# Patient Record
Sex: Female | Born: 1935 | Race: White | Hispanic: No | Marital: Married | Smoking: Former smoker
Health system: Southern US, Community
[De-identification: ages and names within clinical notes are randomized; demographics above are authoritative.]

## PROBLEM LIST (undated history)

## (undated) DIAGNOSIS — I214 Non-ST elevation (NSTEMI) myocardial infarction: Secondary | ICD-10-CM

## (undated) DIAGNOSIS — I10 Essential (primary) hypertension: Secondary | ICD-10-CM

## (undated) HISTORY — PX: ABDOMINAL HYSTERECTOMY: SHX81

## (undated) HISTORY — DX: Non-ST elevation (NSTEMI) myocardial infarction: I21.4

---

## 2014-06-07 DIAGNOSIS — L820 Inflamed seborrheic keratosis: Secondary | ICD-10-CM | POA: Diagnosis not present

## 2014-06-07 DIAGNOSIS — L303 Infective dermatitis: Secondary | ICD-10-CM | POA: Diagnosis not present

## 2014-06-07 DIAGNOSIS — D225 Melanocytic nevi of trunk: Secondary | ICD-10-CM | POA: Diagnosis not present

## 2014-06-07 DIAGNOSIS — C8200 Follicular lymphoma grade I, unspecified site: Secondary | ICD-10-CM | POA: Diagnosis not present

## 2014-06-07 DIAGNOSIS — D485 Neoplasm of uncertain behavior of skin: Secondary | ICD-10-CM | POA: Diagnosis not present

## 2014-09-12 DIAGNOSIS — R05 Cough: Secondary | ICD-10-CM | POA: Diagnosis not present

## 2014-09-12 DIAGNOSIS — J309 Allergic rhinitis, unspecified: Secondary | ICD-10-CM | POA: Diagnosis not present

## 2014-11-22 DIAGNOSIS — J019 Acute sinusitis, unspecified: Secondary | ICD-10-CM | POA: Diagnosis not present

## 2015-03-18 DIAGNOSIS — H9201 Otalgia, right ear: Secondary | ICD-10-CM | POA: Diagnosis not present

## 2015-03-18 DIAGNOSIS — K047 Periapical abscess without sinus: Secondary | ICD-10-CM | POA: Diagnosis not present

## 2015-10-17 DIAGNOSIS — M7130 Other bursal cyst, unspecified site: Secondary | ICD-10-CM | POA: Diagnosis not present

## 2015-10-17 DIAGNOSIS — Z1283 Encounter for screening for malignant neoplasm of skin: Secondary | ICD-10-CM | POA: Diagnosis not present

## 2015-10-17 DIAGNOSIS — L570 Actinic keratosis: Secondary | ICD-10-CM | POA: Diagnosis not present

## 2015-10-17 DIAGNOSIS — X32XXXD Exposure to sunlight, subsequent encounter: Secondary | ICD-10-CM | POA: Diagnosis not present

## 2015-10-17 DIAGNOSIS — L820 Inflamed seborrheic keratosis: Secondary | ICD-10-CM | POA: Diagnosis not present

## 2015-11-04 DIAGNOSIS — R05 Cough: Secondary | ICD-10-CM | POA: Diagnosis not present

## 2015-11-12 DIAGNOSIS — M542 Cervicalgia: Secondary | ICD-10-CM | POA: Diagnosis not present

## 2015-11-18 DIAGNOSIS — N10 Acute tubulo-interstitial nephritis: Secondary | ICD-10-CM | POA: Diagnosis not present

## 2015-11-18 DIAGNOSIS — R509 Fever, unspecified: Secondary | ICD-10-CM | POA: Diagnosis not present

## 2015-11-18 DIAGNOSIS — M542 Cervicalgia: Secondary | ICD-10-CM | POA: Diagnosis not present

## 2015-11-19 DIAGNOSIS — S134XXA Sprain of ligaments of cervical spine, initial encounter: Secondary | ICD-10-CM | POA: Diagnosis not present

## 2015-11-19 DIAGNOSIS — S233XXA Sprain of ligaments of thoracic spine, initial encounter: Secondary | ICD-10-CM | POA: Diagnosis not present

## 2015-11-19 DIAGNOSIS — M47812 Spondylosis without myelopathy or radiculopathy, cervical region: Secondary | ICD-10-CM | POA: Diagnosis not present

## 2015-11-19 DIAGNOSIS — M9901 Segmental and somatic dysfunction of cervical region: Secondary | ICD-10-CM | POA: Diagnosis not present

## 2015-11-20 DIAGNOSIS — M47812 Spondylosis without myelopathy or radiculopathy, cervical region: Secondary | ICD-10-CM | POA: Diagnosis not present

## 2015-11-20 DIAGNOSIS — M9901 Segmental and somatic dysfunction of cervical region: Secondary | ICD-10-CM | POA: Diagnosis not present

## 2015-11-20 DIAGNOSIS — S233XXA Sprain of ligaments of thoracic spine, initial encounter: Secondary | ICD-10-CM | POA: Diagnosis not present

## 2015-11-20 DIAGNOSIS — S134XXA Sprain of ligaments of cervical spine, initial encounter: Secondary | ICD-10-CM | POA: Diagnosis not present

## 2015-11-21 DIAGNOSIS — M47812 Spondylosis without myelopathy or radiculopathy, cervical region: Secondary | ICD-10-CM | POA: Diagnosis not present

## 2015-11-21 DIAGNOSIS — S233XXA Sprain of ligaments of thoracic spine, initial encounter: Secondary | ICD-10-CM | POA: Diagnosis not present

## 2015-11-21 DIAGNOSIS — S134XXA Sprain of ligaments of cervical spine, initial encounter: Secondary | ICD-10-CM | POA: Diagnosis not present

## 2015-11-21 DIAGNOSIS — M9901 Segmental and somatic dysfunction of cervical region: Secondary | ICD-10-CM | POA: Diagnosis not present

## 2015-11-25 DIAGNOSIS — M47812 Spondylosis without myelopathy or radiculopathy, cervical region: Secondary | ICD-10-CM | POA: Diagnosis not present

## 2015-11-25 DIAGNOSIS — M9901 Segmental and somatic dysfunction of cervical region: Secondary | ICD-10-CM | POA: Diagnosis not present

## 2015-11-25 DIAGNOSIS — S233XXA Sprain of ligaments of thoracic spine, initial encounter: Secondary | ICD-10-CM | POA: Diagnosis not present

## 2015-11-25 DIAGNOSIS — S134XXA Sprain of ligaments of cervical spine, initial encounter: Secondary | ICD-10-CM | POA: Diagnosis not present

## 2015-11-29 DIAGNOSIS — M9901 Segmental and somatic dysfunction of cervical region: Secondary | ICD-10-CM | POA: Diagnosis not present

## 2015-11-29 DIAGNOSIS — S233XXA Sprain of ligaments of thoracic spine, initial encounter: Secondary | ICD-10-CM | POA: Diagnosis not present

## 2015-11-29 DIAGNOSIS — M47812 Spondylosis without myelopathy or radiculopathy, cervical region: Secondary | ICD-10-CM | POA: Diagnosis not present

## 2015-11-29 DIAGNOSIS — S134XXA Sprain of ligaments of cervical spine, initial encounter: Secondary | ICD-10-CM | POA: Diagnosis not present

## 2015-12-02 DIAGNOSIS — S233XXA Sprain of ligaments of thoracic spine, initial encounter: Secondary | ICD-10-CM | POA: Diagnosis not present

## 2015-12-02 DIAGNOSIS — M47812 Spondylosis without myelopathy or radiculopathy, cervical region: Secondary | ICD-10-CM | POA: Diagnosis not present

## 2015-12-02 DIAGNOSIS — M9901 Segmental and somatic dysfunction of cervical region: Secondary | ICD-10-CM | POA: Diagnosis not present

## 2015-12-02 DIAGNOSIS — S134XXA Sprain of ligaments of cervical spine, initial encounter: Secondary | ICD-10-CM | POA: Diagnosis not present

## 2015-12-04 DIAGNOSIS — S134XXA Sprain of ligaments of cervical spine, initial encounter: Secondary | ICD-10-CM | POA: Diagnosis not present

## 2015-12-04 DIAGNOSIS — M9901 Segmental and somatic dysfunction of cervical region: Secondary | ICD-10-CM | POA: Diagnosis not present

## 2015-12-04 DIAGNOSIS — M47812 Spondylosis without myelopathy or radiculopathy, cervical region: Secondary | ICD-10-CM | POA: Diagnosis not present

## 2015-12-04 DIAGNOSIS — S233XXA Sprain of ligaments of thoracic spine, initial encounter: Secondary | ICD-10-CM | POA: Diagnosis not present

## 2015-12-09 DIAGNOSIS — S134XXA Sprain of ligaments of cervical spine, initial encounter: Secondary | ICD-10-CM | POA: Diagnosis not present

## 2015-12-09 DIAGNOSIS — M9901 Segmental and somatic dysfunction of cervical region: Secondary | ICD-10-CM | POA: Diagnosis not present

## 2015-12-09 DIAGNOSIS — M47812 Spondylosis without myelopathy or radiculopathy, cervical region: Secondary | ICD-10-CM | POA: Diagnosis not present

## 2015-12-09 DIAGNOSIS — S233XXA Sprain of ligaments of thoracic spine, initial encounter: Secondary | ICD-10-CM | POA: Diagnosis not present

## 2015-12-16 DIAGNOSIS — S233XXA Sprain of ligaments of thoracic spine, initial encounter: Secondary | ICD-10-CM | POA: Diagnosis not present

## 2015-12-16 DIAGNOSIS — M9901 Segmental and somatic dysfunction of cervical region: Secondary | ICD-10-CM | POA: Diagnosis not present

## 2015-12-16 DIAGNOSIS — S134XXA Sprain of ligaments of cervical spine, initial encounter: Secondary | ICD-10-CM | POA: Diagnosis not present

## 2015-12-16 DIAGNOSIS — M47812 Spondylosis without myelopathy or radiculopathy, cervical region: Secondary | ICD-10-CM | POA: Diagnosis not present

## 2016-02-13 DIAGNOSIS — L218 Other seborrheic dermatitis: Secondary | ICD-10-CM | POA: Diagnosis not present

## 2016-02-13 DIAGNOSIS — L820 Inflamed seborrheic keratosis: Secondary | ICD-10-CM | POA: Diagnosis not present

## 2016-03-09 DIAGNOSIS — Z23 Encounter for immunization: Secondary | ICD-10-CM | POA: Diagnosis not present

## 2016-04-15 DIAGNOSIS — Z23 Encounter for immunization: Secondary | ICD-10-CM | POA: Diagnosis not present

## 2016-04-21 DIAGNOSIS — K921 Melena: Secondary | ICD-10-CM | POA: Diagnosis not present

## 2016-04-23 DIAGNOSIS — L570 Actinic keratosis: Secondary | ICD-10-CM | POA: Diagnosis not present

## 2016-04-23 DIAGNOSIS — X32XXXD Exposure to sunlight, subsequent encounter: Secondary | ICD-10-CM | POA: Diagnosis not present

## 2016-04-23 DIAGNOSIS — L820 Inflamed seborrheic keratosis: Secondary | ICD-10-CM | POA: Diagnosis not present

## 2016-04-28 ENCOUNTER — Inpatient Hospital Stay (HOSPITAL_COMMUNITY)
Admission: EM | Admit: 2016-04-28 | Discharge: 2016-04-30 | DRG: 247 | Disposition: A | Discharge status: Home / Self Care | Payer: Medicare Other | Attending: Cardiovascular Disease | Admitting: Cardiovascular Disease

## 2016-04-28 ENCOUNTER — Encounter (INDEPENDENT_AMBULATORY_CARE_PROVIDER_SITE_OTHER): Payer: Self-pay | Admitting: Internal Medicine

## 2016-04-28 ENCOUNTER — Encounter (HOSPITAL_COMMUNITY): Payer: Self-pay | Admitting: *Deleted

## 2016-04-28 ENCOUNTER — Encounter (INDEPENDENT_AMBULATORY_CARE_PROVIDER_SITE_OTHER): Payer: Self-pay

## 2016-04-28 ENCOUNTER — Emergency Department (HOSPITAL_COMMUNITY): Payer: Medicare Other

## 2016-04-28 DIAGNOSIS — I4891 Unspecified atrial fibrillation: Secondary | ICD-10-CM | POA: Diagnosis not present

## 2016-04-28 DIAGNOSIS — R079 Chest pain, unspecified: Secondary | ICD-10-CM | POA: Diagnosis not present

## 2016-04-28 DIAGNOSIS — I495 Sick sinus syndrome: Secondary | ICD-10-CM | POA: Diagnosis not present

## 2016-04-28 DIAGNOSIS — I214 Non-ST elevation (NSTEMI) myocardial infarction: Secondary | ICD-10-CM | POA: Diagnosis present

## 2016-04-28 DIAGNOSIS — I498 Other specified cardiac arrhythmias: Secondary | ICD-10-CM

## 2016-04-28 DIAGNOSIS — Z79899 Other long term (current) drug therapy: Secondary | ICD-10-CM

## 2016-04-28 DIAGNOSIS — I10 Essential (primary) hypertension: Secondary | ICD-10-CM

## 2016-04-28 DIAGNOSIS — I441 Atrioventricular block, second degree: Secondary | ICD-10-CM

## 2016-04-28 DIAGNOSIS — Z87891 Personal history of nicotine dependence: Secondary | ICD-10-CM | POA: Diagnosis not present

## 2016-04-28 DIAGNOSIS — E876 Hypokalemia: Secondary | ICD-10-CM | POA: Diagnosis present

## 2016-04-28 DIAGNOSIS — Z8249 Family history of ischemic heart disease and other diseases of the circulatory system: Secondary | ICD-10-CM

## 2016-04-28 DIAGNOSIS — Z955 Presence of coronary angioplasty implant and graft: Secondary | ICD-10-CM

## 2016-04-28 DIAGNOSIS — R7989 Other specified abnormal findings of blood chemistry: Secondary | ICD-10-CM

## 2016-04-28 DIAGNOSIS — R69 Illness, unspecified: Secondary | ICD-10-CM | POA: Diagnosis not present

## 2016-04-28 DIAGNOSIS — I2510 Atherosclerotic heart disease of native coronary artery without angina pectoris: Secondary | ICD-10-CM | POA: Diagnosis present

## 2016-04-28 DIAGNOSIS — I509 Heart failure, unspecified: Secondary | ICD-10-CM | POA: Diagnosis not present

## 2016-04-28 DIAGNOSIS — R0602 Shortness of breath: Secondary | ICD-10-CM | POA: Diagnosis not present

## 2016-04-28 DIAGNOSIS — R778 Other specified abnormalities of plasma proteins: Secondary | ICD-10-CM

## 2016-04-28 DIAGNOSIS — R531 Weakness: Secondary | ICD-10-CM | POA: Diagnosis not present

## 2016-04-28 DIAGNOSIS — Z6824 Body mass index (BMI) 24.0-24.9, adult: Secondary | ICD-10-CM | POA: Diagnosis not present

## 2016-04-28 DIAGNOSIS — I491 Atrial premature depolarization: Secondary | ICD-10-CM | POA: Diagnosis not present

## 2016-04-28 DIAGNOSIS — D649 Anemia, unspecified: Secondary | ICD-10-CM | POA: Diagnosis not present

## 2016-04-28 HISTORY — DX: Essential (primary) hypertension: I10

## 2016-04-28 LAB — COMPREHENSIVE METABOLIC PANEL
ALT: 24 U/L (ref 14–54)
ANION GAP: 12 (ref 5–15)
AST: 37 U/L (ref 15–41)
Albumin: 4.4 g/dL (ref 3.5–5.0)
Alkaline Phosphatase: 82 U/L (ref 38–126)
BUN: 21 mg/dL — ABNORMAL HIGH (ref 6–20)
CHLORIDE: 100 mmol/L — AB (ref 101–111)
CO2: 22 mmol/L (ref 22–32)
CREATININE: 0.98 mg/dL (ref 0.44–1.00)
Calcium: 9.6 mg/dL (ref 8.9–10.3)
GFR calc non Af Amer: 54 mL/min — ABNORMAL LOW (ref >60)
Glucose, Bld: 113 mg/dL — ABNORMAL HIGH (ref 65–99)
POTASSIUM: 2.7 mmol/L — AB (ref 3.5–5.1)
SODIUM: 134 mmol/L — AB (ref 135–145)
Total Bilirubin: 0.6 mg/dL (ref 0.3–1.2)
Total Protein: 7.3 g/dL (ref 6.5–8.1)

## 2016-04-28 LAB — CBC WITH DIFFERENTIAL/PLATELET
Basophils Absolute: 0 10*3/uL (ref 0.0–0.1)
Basophils Relative: 0 %
EOS ABS: 0 10*3/uL (ref 0.0–0.7)
EOS PCT: 0 %
HCT: 33.4 % — ABNORMAL LOW (ref 36.0–46.0)
Hemoglobin: 10.7 g/dL — ABNORMAL LOW (ref 12.0–15.0)
LYMPHS ABS: 2 10*3/uL (ref 0.7–4.0)
Lymphocytes Relative: 17 %
MCH: 25.5 pg — AB (ref 26.0–34.0)
MCHC: 32 g/dL (ref 30.0–36.0)
MCV: 79.7 fL (ref 78.0–100.0)
MONO ABS: 1.3 10*3/uL — AB (ref 0.1–1.0)
Monocytes Relative: 10 %
Neutro Abs: 8.6 10*3/uL — ABNORMAL HIGH (ref 1.7–7.7)
Neutrophils Relative %: 73 %
PLATELETS: 366 10*3/uL (ref 150–400)
RBC: 4.19 MIL/uL (ref 3.87–5.11)
RDW: 14.5 % (ref 11.5–15.5)
WBC: 12 10*3/uL — AB (ref 4.0–10.5)

## 2016-04-28 LAB — TROPONIN I
TROPONIN I: 1.31 ng/mL — AB (ref <0.03)
TROPONIN I: 1.14 ng/mL — AB (ref <0.03)

## 2016-04-28 LAB — MAGNESIUM: MAGNESIUM: 1.5 mg/dL — AB (ref 1.7–2.4)

## 2016-04-28 MED ORDER — PANTOPRAZOLE SODIUM 40 MG PO TBEC
40.0000 mg | DELAYED_RELEASE_TABLET | Freq: Every day | ORAL | Status: DC
  Administered 2016-04-29 – 2016-04-30 (×2): 40 mg via ORAL
  Filled 2016-04-28 (×2): qty 1

## 2016-04-28 MED ORDER — HEPARIN BOLUS VIA INFUSION
3500.0000 [IU] | Freq: Once | INTRAVENOUS | Status: AC
  Administered 2016-04-28: 3500 [IU] via INTRAVENOUS
  Filled 2016-04-28: qty 3500

## 2016-04-28 MED ORDER — SODIUM CHLORIDE 0.9 % IV SOLN
30.0000 meq | Freq: Once | INTRAVENOUS | Status: AC
  Administered 2016-04-28: 30 meq via INTRAVENOUS
  Filled 2016-04-28: qty 15

## 2016-04-28 MED ORDER — POTASSIUM CHLORIDE CRYS ER 20 MEQ PO TBCR
40.0000 meq | EXTENDED_RELEASE_TABLET | Freq: Two times a day (BID) | ORAL | Status: DC
  Administered 2016-04-28 – 2016-04-30 (×5): 40 meq via ORAL
  Filled 2016-04-28 (×5): qty 2

## 2016-04-28 MED ORDER — ASPIRIN EC 81 MG PO TBEC
81.0000 mg | DELAYED_RELEASE_TABLET | Freq: Every day | ORAL | Status: DC
  Administered 2016-04-29 – 2016-04-30 (×2): 81 mg via ORAL
  Filled 2016-04-28 (×2): qty 1

## 2016-04-28 MED ORDER — POTASSIUM CHLORIDE 10 MEQ/100ML IV SOLN
10.0000 meq | INTRAVENOUS | Status: DC
  Administered 2016-04-28: 10 meq via INTRAVENOUS
  Filled 2016-04-28: qty 100

## 2016-04-28 MED ORDER — ASPIRIN 81 MG PO CHEW
324.0000 mg | CHEWABLE_TABLET | Freq: Once | ORAL | Status: AC
  Administered 2016-04-28: 324 mg via ORAL
  Filled 2016-04-28: qty 4

## 2016-04-28 MED ORDER — MAGNESIUM SULFATE 2 GM/50ML IV SOLN
2.0000 g | Freq: Once | INTRAVENOUS | Status: AC
  Administered 2016-04-28: 2 g via INTRAVENOUS
  Filled 2016-04-28: qty 50

## 2016-04-28 MED ORDER — NITROGLYCERIN 0.4 MG SL SUBL: 0.4000 mg | SUBLINGUAL_TABLET | SUBLINGUAL | Status: DC | PRN

## 2016-04-28 MED ORDER — ATORVASTATIN CALCIUM 40 MG PO TABS
40.0000 mg | ORAL_TABLET | Freq: Every day | ORAL | Status: DC
  Administered 2016-04-28 – 2016-04-29 (×2): 40 mg via ORAL
  Filled 2016-04-28 (×2): qty 1

## 2016-04-28 MED ORDER — ACETAMINOPHEN 325 MG PO TABS: 650.0000 mg | ORAL_TABLET | ORAL | Status: DC | PRN

## 2016-04-28 MED ORDER — ONDANSETRON HCL 4 MG/2ML IJ SOLN
4.0000 mg | Freq: Four times a day (QID) | INTRAMUSCULAR | Status: DC | PRN
  Administered 2016-04-29: 20:00:00 4 mg via INTRAVENOUS
  Filled 2016-04-28: qty 2

## 2016-04-28 MED ORDER — HEPARIN (PORCINE) IN NACL 100-0.45 UNIT/ML-% IJ SOLN
800.0000 [IU]/h | INTRAMUSCULAR | Status: DC
  Administered 2016-04-28: 700 [IU]/h via INTRAVENOUS
  Filled 2016-04-28: qty 250

## 2016-04-28 NOTE — ED Notes (Signed)
CRITICAL VALUE ALERT  Critical value received:troponin  Date of notification: 04/28/2016  Time of notification:1544  Critical value read back yes  Nurse who received alert: Beyonce Sawatzky   MD notified (1st page):  1544  Time of first page: 1544  MD notified (2nd page):  Time of second page:  Responding MD: issacs  Time MD responded:1544

## 2016-04-28 NOTE — H&P (Addendum)
CARDIOLOGY CONSULT NOTE  Patient ID: Adriana Moyer MRN: [id number] DOB/AGE: 01/22/1936 80 y.o.  Admit date: 04/28/2016 Primary Physician: Jeri Modena Referring Physician:   Reason for Admission: elevated troponin  HPI: 80 yr old woman with a PMH significant for hypertension presented to her PCP with complaints of fatigue. She was lightheaded and dizzy with shortness of breath. Denies chest pain. This past Sunday, she briefly lost consciousness while speaking with her husband, possibly no more than 15 seconds. Denies antecedent chest pain or palpitations. She then felt markedly fatigued and thought she had the flu. She had a little diarrhea as well. Denies dysuria and fevers.  Last night, she did complain of dull retrosternal chest pressure lasting hours. She was then sent from  PCP to [facility], and subsequently to [facility] after troponin found to be elevated to 1.31. Also hypokalemic, 2.7, and hypomagnesemic, 1.5.  Initial ECG shows ectopic atrial rhythm followed by a pause followed by junctional beats, with other ECG's showing the same.    No Known Allergies  Current Facility-Administered Medications  Medication Dose Route Frequency Provider Last Rate Last Dose  . potassium chloride 30 mEq in sodium chloride 0.9 % 265 mL (KCL MULTIRUN) IVPB  30 mEq Intravenous Once Dorie Rank, MD      . potassium chloride SA (K-DUR,KLOR-CON) CR tablet 40 mEq  40 mEq Oral BID Duffy Bruce, MD   40 mEq at 04/28/16 1615   Current Outpatient Prescriptions  Medication Sig Dispense Refill  . clotrimazole (LOTRIMIN) 1 % cream Apply 1 application topically daily.    . hydrochlorothiazide (HYDRODIURIL) 25 MG tablet Take 25 mg by mouth daily.    Marland Kitchen omeprazole (PRILOSEC) 20 MG capsule Take 20 mg by mouth daily.      Past Medical History:  Diagnosis Date  . Hypertension     Past Surgical History:  Procedure Laterality Date  . ABDOMINAL HYSTERECTOMY      Social  History   Social History  . Marital status: Married    Spouse name: N/A  . Number of children: N/A  . Years of education: N/A   Occupational History  . Not on file.   Social History Main Topics  . Smoking status: Former Research scientist (life sciences)  . Smokeless tobacco: Never Used  . Alcohol use No  . Drug use: No  . Sexual activity: Not on file   Other Topics Concern  . Not on file   Social History Narrative  . No narrative on file     No family history of premature CAD in 1st degree relatives.  Prior to Admission medications   Medication Sig Start Date End Date Taking? Authorizing Provider  clotrimazole (LOTRIMIN) 1 % cream Apply 1 application topically daily.   Yes Historical Provider, MD  hydrochlorothiazide (HYDRODIURIL) 25 MG tablet Take 25 mg by mouth daily.   Yes Historical Provider, MD  omeprazole (PRILOSEC) 20 MG capsule Take 20 mg by mouth daily.   Yes Historical Provider, MD     Review of systems complete and found to be negative unless listed above in HPI     Physical exam Blood pressure 155/77, pulse 72, temperature 98.3 F (36.8 C), temperature source Oral, resp. rate 13, height 5\' 2"  (1.575 m), weight 132 lb (59.9 kg), SpO2 99 %. General: NAD Neck: No JVD, no thyromegaly or thyroid nodule.  Lungs: Clear to auscultation bilaterally with normal respiratory effort. CV: Nondisplaced PMI. Regular rate and irregular rhythm, normal S1/S2, no  S3/S4, no murmur.  No peripheral edema.  No carotid bruit.  Normal pedal pulses.  Abdomen: Soft, nontender, no hepatosplenomegaly, no distention.  Skin: Intact without lesions or rashes.  Neurologic: Alert and oriented x 3.  Psych: Normal affect. Extremities: No clubbing or cyanosis.  HEENT: Normal.   ECG: Most recent ECG reviewed.  Labs:   Lab Results  Component Value Date   WBC 12.0 (H) 04/28/2016   HGB 10.7 (L) 04/28/2016   HCT 33.4 (L) 04/28/2016   MCV 79.7 04/28/2016   PLT 366 04/28/2016    Recent Labs Lab  04/28/16 1410  NA 134*  K 2.7*  CL 100*  CO2 22  BUN 21*  CREATININE 0.98  CALCIUM 9.6  PROT 7.3  BILITOT 0.6  ALKPHOS 82  ALT 24  AST 37  GLUCOSE 113*   Lab Results  Component Value Date   TROPONINI 1.31 (HH) 04/28/2016   No results found for: CHOL No results found for: HDL No results found for: LDLCALC No results found for: TRIG No results found for: CHOLHDL No results found for: LDLDIRECT       Studies: Dg Chest 2 View  Result Date: 04/28/2016 CLINICAL DATA:  Onset of shortness of breath, body aches, and chills last night. History of hypertension, former smoker. EXAM: CHEST  2 VIEW COMPARISON:  None in PACs FINDINGS: The lungs are adequately inflated. There is no focal infiltrate. There is prominence of the costochondral calcification in the lower ribs bilaterally. There is no pleural effusion or pneumothorax. The heart and pulmonary vascularity are normal. There is calcification within the wall of the tortuous thoracic aorta. The mediastinum is normal in width. The thoracic vertebral bodies are preserved in height. There is multilevel degenerative disc space narrowing. There is moderate to severe degenerative change of both shoulders. IMPRESSION: There is no evidence of pneumonia nor other acute cardiopulmonary abnormality. Thoracic aortic atherosclerosis. Electronically Signed   By: David  Martinique M.D.   On: 04/28/2016 15:09    ASSESSMENT AND PLAN:  1. Arrhythmia: ECG's described above. Will replete potassium and reevaluate rhythm.  2. NSTEMI: Initial troponin 1.31. Will cycle 2 more sets. If they continue to rise, will plan for coronary angiography on 12/6. Obtain echocardiogram tomorrow. Given arrhythmia with junctional beats, will hold off on beta blockers for now until rhythm stabilizes after K repletement. Will start Lipitor 40 mg.  3. HTN: Controlled. Will monitor.  4. Hypokalemia: Possibly related to diarrhea. Being repleted.    Signed: Kate Sable,  M.D., F.A.C.C.  04/28/2016, 7:47 PM

## 2016-04-28 NOTE — Progress Notes (Signed)
CRITICAL VALUE ALERT  Critical value received:  Troponin 1.14 Date of notification:  04/28/2016  Time of notification:  2300  Critical value read back: YES  Nurse who received alert:  Dorise Bullion  MD notified (1st page):  Ellyn Hack  Time of first page:  2302  MD notified (2nd page):  Time of second page:  Responding MD:  Ellyn Hack   Time MD responded:  2305  No changes at this time

## 2016-04-28 NOTE — ED Notes (Signed)
Attempted report x1. 

## 2016-04-28 NOTE — ED Notes (Signed)
[place] sandwich given to pt per VO Dr. Tomi Bamberger

## 2016-04-28 NOTE — Progress Notes (Signed)
ANTICOAGULATION CONSULT NOTE - Initial Consult  Pharmacy Consult for heparin Indication: chest pain/ACS  No Known Allergies  Patient Measurements: Height: 5\' 2"  (157.5 cm) Weight: 132 lb (59.9 kg) IBW/kg (Calculated) : 50.1 Heparin Dosing Weight: 60 kg  Vital Signs: Temp: 98.3 F (36.8 C) (12/05 1803) Temp Source: Oral (12/05 1803) BP: 129/51 (12/05 1957) Pulse Rate: 64 (12/05 1957)  Labs:  Recent Labs  04/28/16 1410  HGB 10.7*  HCT 33.4*  PLT 366  CREATININE 0.98  TROPONINI 1.31*    Estimated Creatinine Clearance: 37.4 mL/min (by C-G formula based on SCr of 0.98 mg/dL).  Assessment: 80 yo f presenting with elevated troponin   PMH: HTN  Anticoag: none pta - iv hep for r/o ACS  Nephro: SCr 0.98  Heme/Onc: H&H 10.7/33.4, Plt 366  Goal of Therapy:  Heparin level 0.3-0.7 units/ml Monitor platelets by anticoagulation protocol: Yes   Plan:  Heparin bolus 3500 units/hr Initial drip at 700 units/hr Initial HL 0500  Daily HL, CBC F/U Cards plans  Levester Fresh, PharmD, BCPS, BCCCP Clinical Pharmacist Clinical phone for 04/28/2016 [phone] 04/28/2016 8:36 PM

## 2016-04-28 NOTE — ED Notes (Signed)
Paged cardiology to [facility] @[phone] 

## 2016-04-28 NOTE — ED Triage Notes (Signed)
Pt reports weakness and syncopal episode since Sunday. Pt had rectal bleeding that stopped last week and was ongoing for 3 weeks. Pt went to see PCP today and was told she had new onset A. Fib and was anemic. Denies SOB, chest pain. Pt reports headache and sore throat also.

## 2016-04-28 NOTE — ED Notes (Signed)
Gave pt water per dr.

## 2016-04-28 NOTE — ED Notes (Signed)
Per EMS, Pt transferred from [facility]. Pt denies chest pain and SHOB. Pt not in any acute distress.

## 2016-04-28 NOTE — ED Provider Notes (Signed)
[place] DEPT Provider Note   CSN: [id number] Arrival date & time: 04/28/16  1336     History   Chief Complaint Chief Complaint  Patient presents with  . Abnormal ECG    HPI Adriana Moyer is a 80 y.o. female.  HPI 80 year old female with past medical history of hypertension who presents from urgent care for evaluation of possible atrial fibrillation. Patient states that over the last several days, she has felt generally fatigued. She was worried that she had the flu and she felt lightheaded, dizzy with ambulation, as well as more short of breath and usual. On Sunday, she also was reportedly speaking to her husband while standing when she abruptly lost consciousness. She had no prodrome. She immediately regained consciousness and felt generally tired throughout the rest of the day. Last night, she did have some dull, aching, substernal chest pressure that resolved over several hours. She strongly denies any current chest pain. She went to urgent care today for evaluation, at which time EKG showed reported atrial fibrillation. She was subsequent sent here for evaluation. No history of cardiac arrhythmia or coronary history. She does not have a cardiologist.  Past Medical History:  Diagnosis Date  . Hypertension     There are no active problems to display for this patient.   Past Surgical History:  Procedure Laterality Date  . ABDOMINAL HYSTERECTOMY      OB History    No data available       Home Medications    Prior to Admission medications   Medication Sig Start Date End Date Taking? Authorizing Provider  clotrimazole (LOTRIMIN) 1 % cream Apply 1 application topically daily.   Yes Historical Provider, MD  hydrochlorothiazide (HYDRODIURIL) 25 MG tablet Take 25 mg by mouth daily.   Yes Historical Provider, MD  omeprazole (PRILOSEC) 20 MG capsule Take 20 mg by mouth daily.   Yes Historical Provider, MD    Family History No family history on file.  Social  History Social History  Substance Use Topics  . Smoking status: Former Research scientist (life sciences)  . Smokeless tobacco: Never Used  . Alcohol use No     Allergies   Patient has no known allergies.   Review of Systems Review of Systems  Constitutional: Positive for fatigue. Negative for chills and fever.  HENT: Negative for congestion, rhinorrhea and sore throat.   Eyes: Negative for visual disturbance.  Respiratory: Positive for chest tightness (Last night, now resolved) and shortness of breath. Negative for cough and wheezing.   Cardiovascular: Negative for chest pain and leg swelling.  Gastrointestinal: Negative for abdominal pain, diarrhea, nausea and vomiting.  Genitourinary: Negative for dysuria, flank pain, vaginal bleeding and vaginal discharge.  Musculoskeletal: Negative for neck pain.  Skin: Negative for rash.  Allergic/Immunologic: Negative for immunocompromised state.  Neurological: Negative for syncope and headaches.  Hematological: Does not bruise/bleed easily.  All other systems reviewed and are negative.    Physical Exam Updated Vital Signs BP 143/63   Pulse 72   Temp 98.2 F (36.8 C)   Resp 13   Ht 5\' 2"  (1.575 m)   Wt 132 lb (59.9 kg)   SpO2 99%   BMI 24.14 kg/m   Physical Exam  Constitutional: She is oriented to person, place, and time. She appears well-developed and well-nourished. No distress.  HENT:  Head: Normocephalic and atraumatic.  Eyes: Conjunctivae are normal.  Neck: Neck supple.  Cardiovascular: Normal heart sounds.  An irregular rhythm present. Bradycardia present.  Exam reveals  no friction rub.   No murmur heard. Pulmonary/Chest: Effort normal and breath sounds normal. No respiratory distress. She has no wheezes. She has no rales.  Abdominal: She exhibits no distension.  Musculoskeletal: She exhibits no edema.  Neurological: She is alert and oriented to person, place, and time. She exhibits normal muscle tone.  Skin: Skin is warm. Capillary refill  takes less than 2 seconds.  Psychiatric: She has a normal mood and affect.  Nursing note and vitals reviewed.    ED Treatments / Results  Labs (all labs ordered are listed, but only abnormal results are displayed) Labs Reviewed  CBC WITH DIFFERENTIAL/PLATELET - Abnormal; Notable for the following:       Result Value   WBC 12.0 (*)    Hemoglobin 10.7 (*)    HCT 33.4 (*)    MCH 25.5 (*)    Neutro Abs 8.6 (*)    Monocytes Absolute 1.3 (*)    All other components within normal limits  COMPREHENSIVE METABOLIC PANEL - Abnormal; Notable for the following:    Sodium 134 (*)    Potassium 2.7 (*)    Chloride 100 (*)    Glucose, Bld 113 (*)    BUN 21 (*)    GFR calc non Af Amer 54 (*)    All other components within normal limits  MAGNESIUM - Abnormal; Notable for the following:    Magnesium 1.5 (*)    All other components within normal limits  TROPONIN I - Abnormal; Notable for the following:    Troponin I 1.31 (*)    All other components within normal limits    EKG  EKG Interpretation  Date/Time:  Tuesday April 28 2016 15:24:00 EST Ventricular Rate:  72 PR Interval:    QRS Duration: 96 QT Interval:  612 QTC Calculation: 518 R Axis:   8 Text Interpretation:  Type II second degree heart block No ST elevations No pathologic Q waves Confirmed by Vicy Medico MD, Lysbeth Galas [phone]) on 04/28/2016 4:37:47 PM       Radiology Dg Chest 2 View  Result Date: 04/28/2016 CLINICAL DATA:  Onset of shortness of breath, body aches, and chills last night. History of hypertension, former smoker. EXAM: CHEST  2 VIEW COMPARISON:  None in PACs FINDINGS: The lungs are adequately inflated. There is no focal infiltrate. There is prominence of the costochondral calcification in the lower ribs bilaterally. There is no pleural effusion or pneumothorax. The heart and pulmonary vascularity are normal. There is calcification within the wall of the tortuous thoracic aorta. The mediastinum is normal in width. The  thoracic vertebral bodies are preserved in height. There is multilevel degenerative disc space narrowing. There is moderate to severe degenerative change of both shoulders. IMPRESSION: There is no evidence of pneumonia nor other acute cardiopulmonary abnormality. Thoracic aortic atherosclerosis. Electronically Signed   By: David  Martinique M.D.   On: 04/28/2016 15:09    Procedures Procedures (including critical care time)  Medications Ordered in ED Medications  potassium chloride 10 mEq in 100 mL IVPB (10 mEq Intravenous New Bag/Given 04/28/16 1639)  potassium chloride SA (K-DUR,KLOR-CON) CR tablet 40 mEq (40 mEq Oral Given 04/28/16 1615)  magnesium sulfate IVPB 2 g 50 mL (2 g Intravenous New Bag/Given 04/28/16 1628)  aspirin chewable tablet 324 mg (324 mg Oral Given 04/28/16 1613)     Initial Impression / Assessment and Plan / ED Course  I have reviewed the triage vital signs and the nursing notes.  Pertinent labs & imaging results  that were available during my care of the patient were reviewed by me and considered in my medical decision making (see chart for details).  Clinical Course     80 year old female with past medical history of hypertension here with generalized fatigue as well as syncopal episode. On arrival, patient has irregular, intermittently bradycardic rhythm. EKG reviewed and is concerning for type II second-degree heart block. Labwork reviewed and shows elevated troponin at 1.3, hypomagnesemia, and hypokalemia. Concern for possible ischemic event leading to heart block versus electrolyte derangement leading to conduction abnormalities. No signs of ST elevation myocardial infarction at this time and patient is chest pain-free. Patient given aspirin, Alexa R pleaded, and I discussed with Dr. Domenic Polite of cardiology. Will transfer to [facility] for cardiology evaluation in the ED and likely admission for intervention.  Final Clinical Impressions(s) / ED Diagnoses   Final diagnoses:   Elevated troponin  Second degree heart block  Hypokalemia  Hypomagnesemia    New Prescriptions New Prescriptions   No medications on file     Duffy Bruce, MD 04/28/16 1649

## 2016-04-28 NOTE — ED Notes (Signed)
CRITICAL VALUE ALERT  Critical value received: potassium 2.7  Date of notification: 04/28/2016  Time of notification:  [id number]   Critical value read back:yes   Nurse who received alert: Raneshia Derick  MD notified (1st page): 1544  Time of first page:  1544  MD notified (2nd page):  Time of second page:  Responding MD: issacs  Time MD responded:  1544

## 2016-04-29 ENCOUNTER — Inpatient Hospital Stay (HOSPITAL_COMMUNITY): Payer: Medicare Other

## 2016-04-29 ENCOUNTER — Encounter (HOSPITAL_COMMUNITY)
Admission: EM | Disposition: A | Discharge status: Home / Self Care | Payer: Self-pay | Source: Home / Self Care | Attending: Cardiovascular Disease

## 2016-04-29 DIAGNOSIS — I2510 Atherosclerotic heart disease of native coronary artery without angina pectoris: Secondary | ICD-10-CM

## 2016-04-29 DIAGNOSIS — I495 Sick sinus syndrome: Secondary | ICD-10-CM

## 2016-04-29 DIAGNOSIS — I509 Heart failure, unspecified: Secondary | ICD-10-CM

## 2016-04-29 HISTORY — PX: CARDIAC CATHETERIZATION: SHX172

## 2016-04-29 LAB — BASIC METABOLIC PANEL
Anion gap: 7 (ref 5–15)
BUN: 12 mg/dL (ref 6–20)
CO2: 23 mmol/L (ref 22–32)
CREATININE: 0.79 mg/dL (ref 0.44–1.00)
Calcium: 9.1 mg/dL (ref 8.9–10.3)
Chloride: 109 mmol/L (ref 101–111)
GFR calc Af Amer: >60 mL/min (ref >60)
Glucose, Bld: 95 mg/dL (ref 65–99)
Potassium: 3.4 mmol/L — ABNORMAL LOW (ref 3.5–5.1)
SODIUM: 139 mmol/L (ref 135–145)

## 2016-04-29 LAB — ECHOCARDIOGRAM COMPLETE
Height: 63 in
Weight: 2116.8 oz

## 2016-04-29 LAB — CBC
HEMATOCRIT: 30.8 % — AB (ref 36.0–46.0)
HEMOGLOBIN: 9.7 g/dL — AB (ref 12.0–15.0)
MCH: 24.6 pg — ABNORMAL LOW (ref 26.0–34.0)
MCHC: 31.5 g/dL (ref 30.0–36.0)
MCV: 78.2 fL (ref 78.0–100.0)
Platelets: 303 10*3/uL (ref 150–400)
RBC: 3.94 MIL/uL (ref 3.87–5.11)
RDW: 14.7 % (ref 11.5–15.5)
WBC: 7.4 10*3/uL (ref 4.0–10.5)

## 2016-04-29 LAB — HEPARIN LEVEL (UNFRACTIONATED)
Heparin Unfractionated: 0.26 IU/mL — ABNORMAL LOW (ref 0.30–0.70)
Heparin Unfractionated: 0.3 IU/mL (ref 0.30–0.70)

## 2016-04-29 LAB — PROTIME-INR
INR: 1.08
Prothrombin Time: 14 seconds (ref 11.4–15.2)

## 2016-04-29 LAB — LIPID PANEL
CHOLESTEROL: 175 mg/dL (ref 0–200)
HDL: 52 mg/dL (ref >40)
LDL CALC: 104 mg/dL — AB (ref 0–99)
TRIGLYCERIDES: 95 mg/dL (ref <150)
Total CHOL/HDL Ratio: 3.4 RATIO
VLDL: 19 mg/dL (ref 0–40)

## 2016-04-29 LAB — TROPONIN I
TROPONIN I: 1.07 ng/mL — AB (ref <0.03)
TROPONIN I: 0.74 ng/mL — AB (ref <0.03)
Troponin I: 0.99 ng/mL (ref <0.03)

## 2016-04-29 LAB — POCT ACTIVATED CLOTTING TIME: ACTIVATED CLOTTING TIME: 252 s

## 2016-04-29 SURGERY — LEFT HEART CATH AND CORONARY ANGIOGRAPHY

## 2016-04-29 MED ORDER — SODIUM CHLORIDE 0.9% FLUSH: 3.0000 mL | INTRAVENOUS | Status: DC | PRN

## 2016-04-29 MED ORDER — ALUM & MAG HYDROXIDE-SIMETH 200-200-20 MG/5ML PO SUSP
30.0000 mL | ORAL | Status: DC | PRN
  Administered 2016-04-29 – 2016-04-30 (×2): 30 mL via ORAL
  Filled 2016-04-29 (×2): qty 30

## 2016-04-29 MED ORDER — SODIUM CHLORIDE 0.9 % WEIGHT BASED INFUSION: 3.0000 mL/kg/h | INTRAVENOUS | Status: DC

## 2016-04-29 MED ORDER — CLOPIDOGREL BISULFATE 300 MG PO TABS
ORAL_TABLET | ORAL | Status: AC
  Filled 2016-04-29: qty 1

## 2016-04-29 MED ORDER — LABETALOL HCL 5 MG/ML IV SOLN: 10.0000 mg | INTRAVENOUS | Status: AC | PRN

## 2016-04-29 MED ORDER — IOPAMIDOL (ISOVUE-370) INJECTION 76%
INTRAVENOUS | Status: AC
  Filled 2016-04-29: qty 100

## 2016-04-29 MED ORDER — VERAPAMIL HCL 2.5 MG/ML IV SOLN
INTRAVENOUS | Status: AC
  Filled 2016-04-29: qty 2

## 2016-04-29 MED ORDER — HEPARIN SODIUM (PORCINE) 1000 UNIT/ML IJ SOLN
INTRAMUSCULAR | Status: AC
  Filled 2016-04-29: qty 1

## 2016-04-29 MED ORDER — SODIUM CHLORIDE 0.9% FLUSH: 3.0000 mL | Freq: Two times a day (BID) | INTRAVENOUS | Status: DC

## 2016-04-29 MED ORDER — ASPIRIN 81 MG PO CHEW: 81.0000 mg | CHEWABLE_TABLET | ORAL | Status: DC

## 2016-04-29 MED ORDER — HYDRALAZINE HCL 20 MG/ML IJ SOLN: 5.0000 mg | INTRAMUSCULAR | Status: AC | PRN

## 2016-04-29 MED ORDER — MIDAZOLAM HCL 2 MG/2ML IJ SOLN
INTRAMUSCULAR | Status: AC
  Filled 2016-04-29: qty 2

## 2016-04-29 MED ORDER — VERAPAMIL HCL 2.5 MG/ML IV SOLN
INTRAVENOUS | Status: DC | PRN
  Administered 2016-04-29: 10 mL via INTRA_ARTERIAL

## 2016-04-29 MED ORDER — ASPIRIN 81 MG PO CHEW: 81.0000 mg | CHEWABLE_TABLET | ORAL | Status: AC

## 2016-04-29 MED ORDER — LIDOCAINE HCL (PF) 1 % IJ SOLN
INTRAMUSCULAR | Status: AC
  Filled 2016-04-29: qty 30

## 2016-04-29 MED ORDER — HEPARIN (PORCINE) IN NACL 2-0.9 UNIT/ML-% IJ SOLN
INTRAMUSCULAR | Status: AC
  Filled 2016-04-29: qty 1000

## 2016-04-29 MED ORDER — IOPAMIDOL (ISOVUE-370) INJECTION 76%
INTRAVENOUS | Status: DC | PRN
  Administered 2016-04-29: 100 mL via INTRA_ARTERIAL

## 2016-04-29 MED ORDER — HEPARIN (PORCINE) IN NACL 2-0.9 UNIT/ML-% IJ SOLN
INTRAMUSCULAR | Status: DC | PRN
  Administered 2016-04-29: 1000 mL

## 2016-04-29 MED ORDER — SODIUM CHLORIDE 0.9 % IV SOLN: 250.0000 mL | INTRAVENOUS | Status: DC | PRN

## 2016-04-29 MED ORDER — SODIUM CHLORIDE 0.9 % WEIGHT BASED INFUSION: 1.0000 mL/kg/h | INTRAVENOUS | Status: DC

## 2016-04-29 MED ORDER — CLOPIDOGREL BISULFATE 75 MG PO TABS
75.0000 mg | ORAL_TABLET | Freq: Every day | ORAL | Status: DC
  Administered 2016-04-30: 75 mg via ORAL
  Filled 2016-04-29: qty 1

## 2016-04-29 MED ORDER — CLOPIDOGREL BISULFATE 300 MG PO TABS
ORAL_TABLET | ORAL | Status: DC | PRN
  Administered 2016-04-29: 600 mg via ORAL

## 2016-04-29 MED ORDER — FENTANYL CITRATE (PF) 100 MCG/2ML IJ SOLN
INTRAMUSCULAR | Status: AC
  Filled 2016-04-29: qty 2

## 2016-04-29 MED ORDER — SODIUM CHLORIDE 0.9 % IV SOLN
INTRAVENOUS | Status: DC
  Administered 2016-04-29 (×2): via INTRAVENOUS

## 2016-04-29 MED ORDER — FENTANYL CITRATE (PF) 100 MCG/2ML IJ SOLN
INTRAMUSCULAR | Status: DC | PRN
  Administered 2016-04-29 (×2): 25 ug via INTRAVENOUS

## 2016-04-29 MED ORDER — NITROGLYCERIN 1 MG/10 ML FOR IR/CATH LAB
INTRA_ARTERIAL | Status: AC
  Filled 2016-04-29: qty 10

## 2016-04-29 MED ORDER — HEPARIN SODIUM (PORCINE) 1000 UNIT/ML IJ SOLN
INTRAMUSCULAR | Status: DC | PRN
  Administered 2016-04-29 (×2): 3000 [IU] via INTRAVENOUS

## 2016-04-29 MED ORDER — LIDOCAINE HCL (PF) 1 % IJ SOLN
INTRAMUSCULAR | Status: DC | PRN
  Administered 2016-04-29: 2 mL

## 2016-04-29 MED ORDER — MIDAZOLAM HCL 2 MG/2ML IJ SOLN
INTRAMUSCULAR | Status: DC | PRN
  Administered 2016-04-29: 1 mg via INTRAVENOUS

## 2016-04-29 MED ORDER — ANGIOPLASTY BOOK
Freq: Once | Status: AC
  Administered 2016-04-29: 20:00:00
  Filled 2016-04-29: qty 1

## 2016-04-29 MED ORDER — IOPAMIDOL (ISOVUE-370) INJECTION 76%
INTRAVENOUS | Status: AC
  Filled 2016-04-29: qty 50

## 2016-04-29 MED ORDER — SODIUM CHLORIDE 0.9% FLUSH
3.0000 mL | Freq: Two times a day (BID) | INTRAVENOUS | Status: DC
  Administered 2016-04-29: 3 mL via INTRAVENOUS

## 2016-04-29 MED ORDER — HEART ATTACK BOUNCING BOOK
Freq: Once | Status: AC
  Administered 2016-04-29: 20:00:00
  Filled 2016-04-29: qty 1

## 2016-04-29 SURGICAL SUPPLY — 19 items
BALLN ~~LOC~~ EUPHORA RX 3.5X12 (BALLOONS) ×3
BALLOON ~~LOC~~ EUPHORA RX 3.5X12 (BALLOONS) ×1 IMPLANT
CATH EXPO 5F FL3.5 (CATHETERS) ×3 IMPLANT
CATH INFINITI 5FR ANG PIGTAIL (CATHETERS) ×3 IMPLANT
CATH OPTITORQUE JACKY 4.0 5F (CATHETERS) ×3 IMPLANT
CATH VISTA GUIDE 6FR AL1 (CATHETERS) ×3 IMPLANT
DEVICE RAD COMP TR BAND LRG (VASCULAR PRODUCTS) ×3 IMPLANT
GLIDESHEATH SLEND SS 6F .021 (SHEATH) ×3 IMPLANT
GUIDEWIRE INQWIRE 1.5J.035X260 (WIRE) ×1 IMPLANT
INQWIRE 1.5J .035X260CM (WIRE) ×3
KIT ENCORE 26 ADVANTAGE (KITS) ×3 IMPLANT
KIT HEART LEFT (KITS) ×3 IMPLANT
PACK CARDIAC CATHETERIZATION (CUSTOM PROCEDURE TRAY) ×3 IMPLANT
STENT RESOLUTE ONYX 3.0X15 (Permanent Stent) ×3 IMPLANT
SYR MEDRAD MARK V 150ML (SYRINGE) ×3 IMPLANT
TRANSDUCER W/STOPCOCK (MISCELLANEOUS) ×3 IMPLANT
TUBING CIL FLEX 10 FLL-RA (TUBING) ×3 IMPLANT
WIRE HI TORQ VERSACORE-J 145CM (WIRE) ×3 IMPLANT
WIRE RUNTHROUGH .014X180CM (WIRE) ×3 IMPLANT

## 2016-04-29 NOTE — Progress Notes (Signed)
Patient Name: Adriana Moyer Date of Encounter: 04/29/2016  Primary Cardiologist: New, seen by Dr. Bronson Ing last night.   Hospital Problem List     Active Problems:   NSTEMI (non-ST elevated myocardial infarction) ([place])    Subjective   Feels well, denies chest pain and SOB. Denies syncope and presyncope.   Inpatient Medications    Scheduled Meds: . aspirin EC  81 mg Oral Daily  . atorvastatin  40 mg Oral q1800  . pantoprazole  40 mg Oral Daily  . potassium chloride  40 mEq Oral BID   Continuous Infusions: . heparin 800 Units/hr (04/29/16 0622)   PRN Meds: acetaminophen, nitroGLYCERIN, ondansetron (ZOFRAN) IV   Vital Signs    Vitals:   04/28/16 2045 04/28/16 2200 04/28/16 2310 04/29/16 0539  BP: 134/70 114/71 133/61 (!) 154/61  Pulse: (!) 55 69 67 89  Resp: 14 16 (!) 21 11  Temp:   98.2 F (36.8 C) 98.2 F (36.8 C)  TempSrc:   Oral Oral  SpO2: 98% 100% 98%   Weight:   132 lb 4.8 oz (60 kg) 132 lb 4.8 oz (60 kg)  Height:   5\' 3"  (1.6 m)     Intake/Output Summary (Last 24 hours) at 04/29/16 0701 Last data filed at 04/29/16 0600  Gross per 24 hour  Intake           345.65 ml  Output              700 ml  Net          -354.35 ml   Filed Weights   04/28/16 1345 04/28/16 2310 04/29/16 0539  Weight: 132 lb (59.9 kg) 132 lb 4.8 oz (60 kg) 132 lb 4.8 oz (60 kg)    Physical Exam    GEN: Well nourished, well developed, in no acute distress.  HEENT: Grossly normal.  Neck: Supple, no JVD, carotid bruits, or masses. Cardiac: irregularly irregular rhythm. No murmurs, rubs, or gallops. No clubbing, cyanosis, edema.  Radials/DP/PT 2+ and equal bilaterally.  Respiratory:  Respirations regular and unlabored, clear to auscultation bilaterally. GI: Soft, nontender, nondistended, BS + x 4. MS: no deformity or atrophy. Skin: warm and dry, no rash. Neuro:  Strength and sensation are intact. Psych: AAOx3.  Normal affect.  Labs    CBC  Recent Labs   04/28/16 1410 04/29/16 0441  WBC 12.0* 7.4  NEUTROABS 8.6*  --   HGB 10.7* 9.7*  HCT 33.4* 30.8*  MCV 79.7 78.2  PLT 366 [id number]   Basic Metabolic Panel  Recent Labs  04/28/16 1410 04/29/16 0441  NA 134* 139  K 2.7* 3.4*  CL 100* 109  CO2 22 23  GLUCOSE 113* 95  BUN 21* 12  CREATININE 0.98 0.79  CALCIUM 9.6 9.1  MG 1.5*  --    Liver Function Tests  Recent Labs  04/28/16 1410  AST 37  ALT 24  ALKPHOS 82  BILITOT 0.6  PROT 7.3  ALBUMIN 4.4   Cardiac Enzymes  Recent Labs  04/28/16 2138 04/28/16 2322 04/29/16 0441  TROPONINI 1.14* 1.07* 0.99*   Fasting Lipid Panel  Recent Labs  04/29/16 0441  CHOL 175  HDL 52  LDLCALC 104*  TRIG 95  CHOLHDL 3.4     Telemetry    Afib, rates in 60's, some sinus "pauses" of 1-2 sec.  - Personally Reviewed  ECG     Afib- Personally Reviewed  Radiology    Dg Chest 2 View  Result  Date: 04/28/2016 CLINICAL DATA:  Onset of shortness of breath, body aches, and chills last night. History of hypertension, former smoker. EXAM: CHEST  2 VIEW COMPARISON:  None in PACs FINDINGS: The lungs are adequately inflated. There is no focal infiltrate. There is prominence of the costochondral calcification in the lower ribs bilaterally. There is no pleural effusion or pneumothorax. The heart and pulmonary vascularity are normal. There is calcification within the wall of the tortuous thoracic aorta. The mediastinum is normal in width. The thoracic vertebral bodies are preserved in height. There is multilevel degenerative disc space narrowing. There is moderate to severe degenerative change of both shoulders. IMPRESSION: There is no evidence of pneumonia nor other acute cardiopulmonary abnormality. Thoracic aortic atherosclerosis. Electronically Signed   By: David  Martinique M.D.   On: 04/28/2016 15:09      Patient Profile     Ms. Im is a 80 year old female with a past medical history of HTN. She presented to her PCP's office with fatigue  and dizziness. She was found to be in Afib, sent to [facility], then had elevated troponin so was transferred to [facility].   Initial ECG showed ectopic atrial rhythm followed by a pause followed by junctional beats, with other ECG's showing the same.   Assessment & Plan   1. Sinus arrhythmia: Likely the cause of her symptoms, tele reviewed over night and it looks like Afib/junctional with some short pauses of 1-2 seconds. She had an episode of briefly losing consciousness this past Sunday. Might need to be seen by EP.   2. Chest pain: She tells me that she has not had any recent chest pain, but according to HPI she had some chest pain at [facility] yesterday. Troponin elevated at 1.31>>1.14>>1.07>>0.99.   Would evaluate by cath. She has a family history of CAD, both of her parents had MI's in their 60's.   3. Hypokalemia: K was 2.7 on admission, repleted. Now 3.4. Potassium ordered 54mEq BID.   Signed, Arbutus Leas, NP  04/29/2016, 7:01 AM   I have seen and examined the patient along with Arbutus Leas, NP.  I have reviewed the chart, notes and new data.  I agree with PA/NP's note.  Key new complaints: symptoms consistent with arrhythmic syncope (very little prodrome), no angina. She did have nausea and fatigue throughout the day. However, cardiac enzymes are mildly abnormal Key examination changes: normal CV exam except irregular rhythm (currently atrial bigeminy) Key new findings / data: in my review, the ECGs clearly show frequent PAcs and bigeminy, periods of ectopic atrial rhythm and sinus pauses. I think that the ECGs labeled as atrial fibrillation probably show sinus rhythm and second degree AV block, Mob[place] 1.  PLAN: In view of abnormal cardiac enzymes and possible angina equivalent (nausea) , we should exclude right coronary ischemia/NSTEMI as a cause of the bradyarrhythmia events (evidence of both sinus node and AV node dysfunction). Recommend coronary angio.This procedure has  been fully reviewed with the patient and written informed consent has been obtained. If there is no evidence of CAD, may need to discuss pacemaker therapy (while no outright evidence of lengthy pauses has been documented to date, bradyarrhythmia would likely be the cause of syncope). Note marked hypokalemia on arrival (due to HCTZ) and mild hypomagnesemia (due to PPI and HCTZ), improving. This might have contributed to the arrhythmia. Reasonable to reevaluate rhythm after sustained correction of K levels.  Sanda Klein, MD, [place] [phone] 04/29/2016, 9:12  AM

## 2016-04-29 NOTE — Interval H&P Note (Signed)
History and Physical Interval Note:  04/29/2016 3:55 PM  Adriana Moyer  has presented today for surgery, with the diagnosis of positive enzy  The various methods of treatment have been discussed with the patient and family. After consideration of risks, benefits and other options for treatment, the patient has consented to  Procedure(s): Left Heart Cath and Coronary Angiography (N/A) as a surgical intervention .  The patient's history has been reviewed, patient examined, no change in status, stable for surgery.  I have reviewed the patient's chart and labs.  Questions were answered to the patient's satisfaction.     Kathlyn [place]

## 2016-04-29 NOTE — Progress Notes (Signed)
ANTICOAGULATION CONSULT NOTE - Follow Up Consult  Pharmacy Consult for Heparin Indication: chest pain/ACS  No Known Allergies  Patient Measurements: Height: 5\' 3"  (160 cm) Weight: 132 lb 4.8 oz (60 kg) IBW/kg (Calculated) : 52.4 Heparin Dosing Weight:    Vital Signs: Temp: 98.2 F (36.8 C) (12/06 0539) Temp Source: Oral (12/06 0539) BP: 154/61 (12/06 0539) Pulse Rate: 89 (12/06 0539)  Labs:  Recent Labs  04/28/16 1410  04/28/16 2322 04/29/16 0441 04/29/16 1104 04/29/16 1253  HGB 10.7*  --   --  9.7*  --   --   HCT 33.4*  --   --  30.8*  --   --   PLT 366  --   --  303  --   --   LABPROT  --   --   --   --   --  14.0  INR  --   --   --   --   --  1.08  HEPARINUNFRC  --   --   --  0.26*  --  0.30  CREATININE 0.98  --   --  0.79  --   --   TROPONINI 1.31*  < > 1.07* 0.99* 0.74*  --   < > = values in this interval not displayed.  Estimated Creatinine Clearance: 47.9 mL/min (by C-G formula based on SCr of 0.79 mg/dL).  Assessment: 80 yo f presenting with elevated troponin, fatigue, dizziness, found to be in afib.  PMH: HTN  Anticoag: NSTEMI, new afib - iv hep for r/o ACS. +enzymes. HL 0.3, Hgb 10.7>9.7  Goal of Therapy:  Heparin level 0.3-0.7 units/ml Monitor platelets by anticoagulation protocol: Yes   Plan:  Cath today Heparin 800 units/hr   Kimaya Whitlatch S. Alford Highland, PharmD, BCPS Clinical Staff Pharmacist Pager [phone]  Eilene Ghazi Stillinger 04/29/2016,1:59 PM

## 2016-04-29 NOTE — Consult Note (Signed)
[facility] CM Primary Care Navigator  04/29/2016  Adriana Moyer 04/08/1936 [id number]    Met with patient, husband Adriana Moyer) and son Adriana Moyer) at the bedside to identify possible discharge needs. Patient reports feeling flu-like symptoms; was lightheaded/ dizzy and was seen at primary care provider's office; she fainted briefly while talking to husband which had led to this admission.  Patient endorses Dr.Terry Quillian Quince [facility] Saunders Glance PA-C) with [place] as the primary care provider.   Patient shared using [facility] pharmacy in [place] and [facility] delivery to obtain medications without any problem.   Patient reports managing her own medications at home straight out of the containers (taking only two medicines).  She is able to drive prior to this admission. Husband will provide transportation to her doctors' appointments as needed after discharge.  Husband is the primary caregiver at home. Son Adriana Moyer) and wife can assist with care if needed.  Discharge plan is home per patient.  Patient and husband voiced understanding to call primary care provider's office when she gets home, for a post discharge follow-up appointment within a week or sooner if needs arise. Patient letter provided as a reminder.  Patient denies any further needs or concerns at this time.  For additional questions please contact:  Edwena Felty A. Kaegan Stigler, BSN, RN-BC [facility] PRIMARY CARE Navigator Cell: [phone]

## 2016-04-29 NOTE — H&P (View-Only) (Signed)
Patient Name: Adriana Moyer Date of Encounter: 04/29/2016  Primary Cardiologist: New, seen by Dr. Bronson Ing last night.   Hospital Problem List     Active Problems:   NSTEMI (non-ST elevated myocardial infarction) ([place])    Subjective   Feels well, denies chest pain and SOB. Denies syncope and presyncope.   Inpatient Medications    Scheduled Meds: . aspirin EC  81 mg Oral Daily  . atorvastatin  40 mg Oral q1800  . pantoprazole  40 mg Oral Daily  . potassium chloride  40 mEq Oral BID   Continuous Infusions: . heparin 800 Units/hr (04/29/16 0622)   PRN Meds: acetaminophen, nitroGLYCERIN, ondansetron (ZOFRAN) IV   Vital Signs    Vitals:   04/28/16 2045 04/28/16 2200 04/28/16 2310 04/29/16 0539  BP: 134/70 114/71 133/61 (!) 154/61  Pulse: (!) 55 69 67 89  Resp: 14 16 (!) 21 11  Temp:   98.2 F (36.8 C) 98.2 F (36.8 C)  TempSrc:   Oral Oral  SpO2: 98% 100% 98%   Weight:   132 lb 4.8 oz (60 kg) 132 lb 4.8 oz (60 kg)  Height:   5\' 3"  (1.6 m)     Intake/Output Summary (Last 24 hours) at 04/29/16 0701 Last data filed at 04/29/16 0600  Gross per 24 hour  Intake           345.65 ml  Output              700 ml  Net          -354.35 ml   Filed Weights   04/28/16 1345 04/28/16 2310 04/29/16 0539  Weight: 132 lb (59.9 kg) 132 lb 4.8 oz (60 kg) 132 lb 4.8 oz (60 kg)    Physical Exam    GEN: Well nourished, well developed, in no acute distress.  HEENT: Grossly normal.  Neck: Supple, no JVD, carotid bruits, or masses. Cardiac: irregularly irregular rhythm. No murmurs, rubs, or gallops. No clubbing, cyanosis, edema.  Radials/DP/PT 2+ and equal bilaterally.  Respiratory:  Respirations regular and unlabored, clear to auscultation bilaterally. GI: Soft, nontender, nondistended, BS + x 4. MS: no deformity or atrophy. Skin: warm and dry, no rash. Neuro:  Strength and sensation are intact. Psych: AAOx3.  Normal affect.  Labs    CBC  Recent Labs   04/28/16 1410 04/29/16 0441  WBC 12.0* 7.4  NEUTROABS 8.6*  --   HGB 10.7* 9.7*  HCT 33.4* 30.8*  MCV 79.7 78.2  PLT 366 [id number]   Basic Metabolic Panel  Recent Labs  04/28/16 1410 04/29/16 0441  NA 134* 139  K 2.7* 3.4*  CL 100* 109  CO2 22 23  GLUCOSE 113* 95  BUN 21* 12  CREATININE 0.98 0.79  CALCIUM 9.6 9.1  MG 1.5*  --    Liver Function Tests  Recent Labs  04/28/16 1410  AST 37  ALT 24  ALKPHOS 82  BILITOT 0.6  PROT 7.3  ALBUMIN 4.4   Cardiac Enzymes  Recent Labs  04/28/16 2138 04/28/16 2322 04/29/16 0441  TROPONINI 1.14* 1.07* 0.99*   Fasting Lipid Panel  Recent Labs  04/29/16 0441  CHOL 175  HDL 52  LDLCALC 104*  TRIG 95  CHOLHDL 3.4     Telemetry    Afib, rates in 60's, some sinus "pauses" of 1-2 sec.  - Personally Reviewed  ECG     Afib- Personally Reviewed  Radiology    Dg Chest 2 View  Result  Date: 04/28/2016 CLINICAL DATA:  Onset of shortness of breath, body aches, and chills last night. History of hypertension, former smoker. EXAM: CHEST  2 VIEW COMPARISON:  None in PACs FINDINGS: The lungs are adequately inflated. There is no focal infiltrate. There is prominence of the costochondral calcification in the lower ribs bilaterally. There is no pleural effusion or pneumothorax. The heart and pulmonary vascularity are normal. There is calcification within the wall of the tortuous thoracic aorta. The mediastinum is normal in width. The thoracic vertebral bodies are preserved in height. There is multilevel degenerative disc space narrowing. There is moderate to severe degenerative change of both shoulders. IMPRESSION: There is no evidence of pneumonia nor other acute cardiopulmonary abnormality. Thoracic aortic atherosclerosis. Electronically Signed   By: Adriana  Moyer M.D.   On: 04/28/2016 15:09      Patient Profile     Adriana Moyer is a 80 year old female with a past medical history of HTN. She presented to her PCP's office with fatigue  and dizziness. She was found to be in Afib, sent to [facility], then had elevated troponin so was transferred to [facility].   Initial ECG showed ectopic atrial rhythm followed by a pause followed by junctional beats, with other ECG's showing the same.   Assessment & Plan   1. Sinus arrhythmia: Likely the cause of her symptoms, tele reviewed over night and it looks like Afib/junctional with some short pauses of 1-2 seconds. She had an episode of briefly losing consciousness this past Sunday. Might need to be seen by EP.   2. Chest pain: She tells me that she has not had any recent chest pain, but according to HPI she had some chest pain at [facility] yesterday. Troponin elevated at 1.31>>1.14>>1.07>>0.99.   Would evaluate by cath. She has a family history of CAD, both of her parents had MI's in their 62's.   3. Hypokalemia: K was 2.7 on admission, repleted. Now 3.4. Potassium ordered 62mEq BID.   Signed, Arbutus Leas, NP  04/29/2016, 7:01 AM   I have seen and examined the patient along with Arbutus Leas, NP.  I have reviewed the chart, notes and new data.  I agree with PA/NP's note.  Key new complaints: symptoms consistent with arrhythmic syncope (very little prodrome), no angina. She did have nausea and fatigue throughout the day. However, cardiac enzymes are mildly abnormal Key examination changes: normal CV exam except irregular rhythm (currently atrial bigeminy) Key new findings / data: in my review, the ECGs clearly show frequent PAcs and bigeminy, periods of ectopic atrial rhythm and sinus pauses. I think that the ECGs labeled as atrial fibrillation probably show sinus rhythm and second degree AV block, Mobitz 1.  PLAN: In view of abnormal cardiac enzymes and possible angina equivalent (nausea) , we should exclude right coronary ischemia/NSTEMI as a cause of the bradyarrhythmia events (evidence of both sinus node and AV node dysfunction). Recommend coronary angio.This procedure has  been fully reviewed with the patient and written informed consent has been obtained. If there is no evidence of CAD, may need to discuss pacemaker therapy (while no outright evidence of lengthy pauses has been documented to date, bradyarrhythmia would likely be the cause of syncope). Note marked hypokalemia on arrival (due to HCTZ) and mild hypomagnesemia (due to PPI and HCTZ), improving. This might have contributed to the arrhythmia. Reasonable to reevaluate rhythm after sustained correction of K levels.  Sanda Klein, MD, [place] [phone] 04/29/2016, 9:12  AM

## 2016-04-29 NOTE — Progress Notes (Signed)
ANTICOAGULATION CONSULT NOTE - Follow Up Consult  Pharmacy Consult for Heparin  Indication: chest pain/ACS  No Known Allergies  Patient Measurements: Height: 5\' 3"  (160 cm) Weight: 132 lb 4.8 oz (60 kg) IBW/kg (Calculated) : 52.4  Vital Signs: Temp: 98.2 F (36.8 C) (12/05 2310) Temp Source: Oral (12/05 2310) BP: 133/61 (12/05 2310) Pulse Rate: 67 (12/05 2310)  Labs:  Recent Labs  04/28/16 1410 04/28/16 2138 04/28/16 2322 04/29/16 0441  HGB 10.7*  --   --  9.7*  HCT 33.4*  --   --  30.8*  PLT 366  --   --  303  HEPARINUNFRC  --   --   --  0.26*  CREATININE 0.98  --   --   --   TROPONINI 1.31* 1.14* 1.07*  --     Estimated Creatinine Clearance: 39.1 mL/min (by C-G formula based on SCr of 0.98 mg/dL).  Assessment: Heparin for elevated troponin while undergoing cardiac work-up, initial heparin level is low at 0.26, no issues per RN.   Goal of Therapy:  Heparin level 0.3-0.7 units/ml Monitor platelets by anticoagulation protocol: Yes   Plan:  -Inc heparin to 800 units/hr -1300 HL  Michaelanthony Kempton 04/29/2016,5:44 AM

## 2016-04-30 ENCOUNTER — Encounter (HOSPITAL_COMMUNITY): Payer: Self-pay | Admitting: Cardiovascular Disease

## 2016-04-30 DIAGNOSIS — I491 Atrial premature depolarization: Secondary | ICD-10-CM

## 2016-04-30 DIAGNOSIS — I441 Atrioventricular block, second degree: Secondary | ICD-10-CM

## 2016-04-30 LAB — CBC
HCT: 30 % — ABNORMAL LOW (ref 36.0–46.0)
HEMOGLOBIN: 9.5 g/dL — AB (ref 12.0–15.0)
MCH: 25.2 pg — AB (ref 26.0–34.0)
MCHC: 31.7 g/dL (ref 30.0–36.0)
MCV: 79.6 fL (ref 78.0–100.0)
PLATELETS: 323 10*3/uL (ref 150–400)
RBC: 3.77 MIL/uL — AB (ref 3.87–5.11)
RDW: 14.9 % (ref 11.5–15.5)
WBC: 9.3 10*3/uL (ref 4.0–10.5)

## 2016-04-30 LAB — BASIC METABOLIC PANEL
ANION GAP: 7 (ref 5–15)
BUN: 13 mg/dL (ref 6–20)
CALCIUM: 8.9 mg/dL (ref 8.9–10.3)
CO2: 19 mmol/L — ABNORMAL LOW (ref 22–32)
Chloride: 111 mmol/L (ref 101–111)
Creatinine, Ser: 0.73 mg/dL (ref 0.44–1.00)
Glucose, Bld: 104 mg/dL — ABNORMAL HIGH (ref 65–99)
POTASSIUM: 4.4 mmol/L (ref 3.5–5.1)
SODIUM: 137 mmol/L (ref 135–145)

## 2016-04-30 LAB — MAGNESIUM: MAGNESIUM: 1.7 mg/dL (ref 1.7–2.4)

## 2016-04-30 MED ORDER — ASPIRIN 81 MG PO TBEC: 81.0000 mg | DELAYED_RELEASE_TABLET | Freq: Every day | ORAL | Status: DC

## 2016-04-30 MED ORDER — LOSARTAN POTASSIUM 25 MG PO TABS: 25.0000 mg | ORAL_TABLET | Freq: Every day | ORAL | 12 refills | Status: DC

## 2016-04-30 MED ORDER — CLOPIDOGREL BISULFATE 75 MG PO TABS: 75.0000 mg | ORAL_TABLET | Freq: Every day | ORAL | 12 refills | Status: DC

## 2016-04-30 MED ORDER — ATORVASTATIN CALCIUM 40 MG PO TABS
40.0000 mg | ORAL_TABLET | Freq: Every day | ORAL | 12 refills | Status: DC
Start: 2016-04-30 — End: 2016-08-06

## 2016-04-30 MED ORDER — PANTOPRAZOLE SODIUM 40 MG PO TBEC: 40.0000 mg | DELAYED_RELEASE_TABLET | Freq: Every day | ORAL | 12 refills | Status: DC

## 2016-04-30 MED ORDER — NITROGLYCERIN 0.4 MG SL SUBL: 0.4000 mg | SUBLINGUAL_TABLET | SUBLINGUAL | 4 refills | Status: DC | PRN

## 2016-04-30 NOTE — Discharge Instructions (Signed)
Radial Site Care Refer to this sheet in the next few weeks. These instructions provide you with information on caring for yourself after your procedure. Your caregiver may also give you more specific instructions. Your treatment has been planned according to current medical practices, but problems sometimes occur. Call your caregiver if you have any problems or questions after your procedure. HOME CARE INSTRUCTIONS  You may shower the day after the procedure.Remove the bandage (dressing) and gently wash the site with plain soap and water.Gently pat the site dry.   Do not apply powder or lotion to the site.   Do not submerge the affected site in water for 3 to 5 days.   Inspect the site at least twice daily.   Do not flex or bend the affected arm for 24 hours.   No lifting over 5 pounds (2.3 kg) for 5 days after your procedure.   Do not drive home if you are discharged the same day of the procedure. Have someone else drive you.   You may drive 24 hours after the procedure unless otherwise instructed by your caregiver.  What to expect:  Any bruising will usually fade within 1 to 2 weeks.   Blood that collects in the tissue (hematoma) may be painful to the touch. It should usually decrease in size and tenderness within 1 to 2 weeks.  SEEK IMMEDIATE MEDICAL CARE IF:  You have unusual pain at the radial site.   You have redness, warmth, swelling, or pain at the radial site.   You have drainage (other than a small amount of blood on the dressing).   You have chills.   You have a fever or persistent symptoms for more than 72 hours.   You have a fever and your symptoms suddenly get worse.   Your arm becomes pale, cool, tingly, or numb.   You have heavy bleeding from the site. Hold pressure on the site.      NO HEAVY LIFTING OR SEXUAL ACTIVITY for 7 DAYS. NO DRIVING for 3-5 DAYS. NO SOAKING BATHS, HOT TUBS, POOLS, ETC., for 7 DAYS

## 2016-04-30 NOTE — Discharge Summary (Signed)
Discharge Summary    Patient ID: Adriana Moyer,  MRN: [id number], DOB/AGE: May 19, 1936 80 y.o.  Admit date: 04/28/2016 Discharge date: 04/30/2016  Primary Care Provider: [facility] Primary Cardiologist: New, Will follow with Dr. Bronson Ing in [facility]   Discharge Diagnoses    Active Problems:   NSTEMI (non-ST elevated myocardial infarction) [facility])   Allergies No Known Allergies  Diagnostic Studies/Procedures    Transthoracic Echocardiography 04/29/16 Study Conclusions  - Left ventricle: The cavity size was normal. Wall thickness was   normal. Systolic function was normal. The estimated ejection   fraction was in the range of 60% to 65%. Wall motion was normal;   there were no regional wall motion abnormalities.  Coronary Stent Intervention  Left Heart Cath and Coronary Angiography 04/29/16   The left ventricular systolic function is normal.  LV end diastolic pressure is normal.  The left ventricular ejection fraction is 55-65% by visual estimate.  Prox RCA lesion, 40 %stenosed.  A drug eluting stent was successfully placed.  Mid RCA lesion, 85 %stenosed.  Post intervention, there is a 0% residual stenosis.  Dist LAD lesion, 60 %stenosed.  2nd Diag lesion, 70 %stenosed.  Prox Cx lesion, 30 %stenosed.   1. Severe one-vessel coronary artery disease involving mid right coronary artery which has a high anterior takeoff almost from the left coronary cusp. 2. Normal LV systolic function and normal left ventricular end-diastolic pressure. 3. Successful drug-eluting stent placement to the mid right coronary artery.  Recommendations: Dual antiplatelet therapy for at least one year. Aggressive treatment of risk factors. It is not entirely clear if coronary artery disease is fully responsible for the patient's arrhythmia. Monitor overnight and treat as needed.  _____________   History of Present Illness   Adriana Moyer is a 80 year old female with a past medical  history of only HTN. She presented to her PCP's office on 04/28/16 with fatigue. Also had an episode of syncope (didn't fall, she lost consciousness while speaking to her husband, it was transient) which occurred on 04/26/16. She was sent by her PCP to [facility] and her troponin was elevated at 1.31, she was hypokalemic with K of 2.7 and Mag was low at 1.5.   She was transferred to [facility]. Her initial EKG showed ectopic atrial rhythm followed by a pause followed by junctional beats. It was felt that her elevated troponin,sinus arrhthymias and symptoms could represent RCA disease.    Hospital Course  She underwent heart cath on 04/29/16 and was found to have mid RCA disease that was treated with DES. She will need DAPT for 12 months. She had a colonoscopy planned, but was advised to reschedule as DAPT cannot be interrupted.   She had some transient 2nd degree AVB, which resolved, but still with some ectopic atrial beats and was asymptommatic. For that reason, beta blocker was avoided.   Her home HCTZ was stopped due to her hypokalemia on admission. Hypokalemia resolved, and we will start her on a ARB for BP control.   Her right radial site was stable without hematoma. We will start a high intensity statin and get FLP and LFT's in 6 weeks.  _____________  Discharge Vitals Blood pressure (!) 150/61, pulse 68, temperature 98 F (36.7 C), temperature source Oral, resp. rate 18, height 5\' 3"  (1.6 m), weight 133 lb 9.6 oz (60.6 kg), SpO2 98 %.  Filed Weights   04/28/16 2310 04/29/16 0539 04/30/16 0228  Weight: 132 lb 4.8 oz (60 kg)  132 lb 4.8 oz (60 kg) 133 lb 9.6 oz (60.6 kg)    Labs & Radiologic Studies     CBC  Recent Labs  04/28/16 1410 04/29/16 0441 04/30/16 0300  WBC 12.0* 7.4 9.3  NEUTROABS 8.6*  --   --   HGB 10.7* 9.7* 9.5*  HCT 33.4* 30.8* 30.0*  MCV 79.7 78.2 79.6  PLT 366 303 [id number]   Basic Metabolic Panel  Recent Labs  04/28/16 1410 04/29/16 0441 04/30/16 0300  NA  134* 139 137  K 2.7* 3.4* 4.4  CL 100* 109 111  CO2 22 23 19*  GLUCOSE 113* 95 104*  BUN 21* 12 13  CREATININE 0.98 0.79 0.73  CALCIUM 9.6 9.1 8.9  MG 1.5*  --  1.7   Liver Function Tests  Recent Labs  04/28/16 1410  AST 37  ALT 24  ALKPHOS 82  BILITOT 0.6  PROT 7.3  ALBUMIN 4.4   Cardiac Enzymes  Recent Labs  04/28/16 2322 04/29/16 0441 04/29/16 1104  TROPONINI 1.07* 0.99* 0.74*   Fasting Lipid Panel  Recent Labs  04/29/16 0441  CHOL 175  HDL 52  LDLCALC 104*  TRIG 95  CHOLHDL 3.4    Dg Chest 2 View  Result Date: 04/28/2016 CLINICAL DATA:  Onset of shortness of breath, body aches, and chills last night. History of hypertension, former smoker. EXAM: CHEST  2 VIEW COMPARISON:  None in PACs FINDINGS: The lungs are adequately inflated. There is no focal infiltrate. There is prominence of the costochondral calcification in the lower ribs bilaterally. There is no pleural effusion or pneumothorax. The heart and pulmonary vascularity are normal. There is calcification within the wall of the tortuous thoracic aorta. The mediastinum is normal in width. The thoracic vertebral bodies are preserved in height. There is multilevel degenerative disc space narrowing. There is moderate to severe degenerative change of both shoulders. IMPRESSION: There is no evidence of pneumonia nor other acute cardiopulmonary abnormality. Thoracic aortic atherosclerosis. Electronically Signed   By: David  Martinique M.D.   On: 04/28/2016 15:09    Disposition   Pt is being discharged home today in good condition.  Follow-up Plans & Appointments    Follow-up Information    Jory Sims, NP Follow up on 05/14/2016.   Specialties:  Nurse Practitioner, Radiology, Cardiology Why:  at 2:30 pm for hospital follow up Contact information: [place] [address] [phone]          Discharge Instructions    AMB Referral to Cardiac Rehabilitation - Phase II    Complete by:  As  directed    Diagnosis:   NSTEMI PTCA     Amb Referral to Cardiac Rehabilitation    Complete by:  As directed    Diagnosis:   Coronary Stents NSTEMI     Diet - low sodium heart healthy    Complete by:  As directed    Increase activity slowly    Complete by:  As directed       Discharge Medications   Discharge Medication List as of 04/30/2016 11:14 AM    START taking these medications   Details  aspirin EC 81 MG EC tablet Take 1 tablet (81 mg total) by mouth daily., Starting Fri 05/01/2016, OTC    atorvastatin (LIPITOR) 40 MG tablet Take 1 tablet (40 mg total) by mouth daily at 6 PM., Starting Thu 04/30/2016, Normal    clopidogrel (PLAVIX) 75 MG tablet Take 1 tablet (75 mg total) by mouth  daily with breakfast., Starting Fri 05/01/2016, Normal    losartan (COZAAR) 25 MG tablet Take 1 tablet (25 mg total) by mouth daily., Starting Thu 04/30/2016, Normal    nitroGLYCERIN (NITROSTAT) 0.4 MG SL tablet Place 1 tablet (0.4 mg total) under the tongue every 5 (five) minutes x 3 doses as needed for chest pain., Starting Thu 04/30/2016, Normal    pantoprazole (PROTONIX) 40 MG tablet Take 1 tablet (40 mg total) by mouth daily., Starting Thu 04/30/2016, Normal      CONTINUE these medications which have NOT CHANGED   Details  clotrimazole (LOTRIMIN) 1 % cream Apply 1 application topically daily., Historical Med      STOP taking these medications     hydrochlorothiazide (HYDRODIURIL) 25 MG tablet      omeprazole (PRILOSEC) 20 MG capsule          Aspirin prescribed at discharge?  Yes High Intensity Statin Prescribed? (Lipitor 40-80mg  or Crestor 20-40mg ): Yes Beta Blocker Prescribed? No: Had some bradycardia  For EF 40% or less, Was ACEI/ARB Prescribed? No: EF normal  ADP Receptor Inhibitor Prescribed? (i.e. Plavix etc.-Includes Medically Managed Patients): Yes For EF <40%, Aldosterone Inhibitor Prescribed? No: EF normal  Was EF assessed during THIS hospitalization? Yes Was Cardiac  Rehab II ordered? (Included Medically managed Patients): Yes   Outstanding Labs/Studies     Duration of Discharge Encounter   Greater than 30 minutes including physician time.  Signed, Arbutus Leas NP 04/30/2016, 12:21 PM

## 2016-04-30 NOTE — Progress Notes (Signed)
Patient Name: Adriana Moyer Date of Encounter: 04/30/2016  Primary Cardiologist: New, seen by Dr. Bronson Ing last night  Hospital Problem List     Active Problems:   NSTEMI (non-ST elevated myocardial infarction) ([facility])     Subjective   Feels well, denies chest pain and SOB.  Inpatient Medications    Scheduled Meds: . aspirin EC  81 mg Oral Daily  . atorvastatin  40 mg Oral q1800  . clopidogrel  75 mg Oral Q breakfast  . pantoprazole  40 mg Oral Daily  . potassium chloride  40 mEq Oral BID  . sodium chloride flush  3 mL Intravenous Q12H   Continuous Infusions:  PRN Meds: sodium chloride, acetaminophen, alum & mag hydroxide-simeth, nitroGLYCERIN, ondansetron (ZOFRAN) IV, sodium chloride flush   Vital Signs    Vitals:   04/29/16 1800 04/29/16 2000 04/29/16 2058 04/30/16 0228  BP: (!) 133/54  (!) 130/37 (!) 119/52  Pulse: 68 (!) 59 (!) 53 (!) 58  Resp: 20 14 17 18   Temp:   98.2 F (36.8 C) 98.2 F (36.8 C)  TempSrc:   Oral Oral  SpO2: 99% 95% 97% 95%  Weight:    133 lb 9.6 oz (60.6 kg)  Height:        Intake/Output Summary (Last 24 hours) at 04/30/16 0741 Last data filed at 04/30/16 0300  Gross per 24 hour  Intake           1420.3 ml  Output              550 ml  Net            870.3 ml   Filed Weights   04/28/16 2310 04/29/16 0539 04/30/16 0228  Weight: 132 lb 4.8 oz (60 kg) 132 lb 4.8 oz (60 kg) 133 lb 9.6 oz (60.6 kg)    Physical Exam  GEN: Well nourished, well developed, in no acute distress.  HEENT: Grossly normal.  Neck: Supple, no JVD, carotid bruits, or masses. Cardiac: irregularly irregular rhythm. No murmurs, rubs, or gallops. No clubbing, cyanosis, edema.  Radials/DP/PT 2+ and equal bilaterally.  Respiratory:  Respirations regular and unlabored, clear to auscultation bilaterally. GI: Soft, nontender, nondistended, BS + x 4. MS: no deformity or atrophy. Skin: warm and dry, no rash. Neuro:  Strength and sensation are intact. Psych:  AAOx3.  Normal affect.  Labs    CBC  Recent Labs  04/28/16 1410 04/29/16 0441 04/30/16 0300  WBC 12.0* 7.4 9.3  NEUTROABS 8.6*  --   --   HGB 10.7* 9.7* 9.5*  HCT 33.4* 30.8* 30.0*  MCV 79.7 78.2 79.6  PLT 366 303 [id number]   Basic Metabolic Panel  Recent Labs  04/28/16 1410 04/29/16 0441 04/30/16 0300  NA 134* 139 137  K 2.7* 3.4* 4.4  CL 100* 109 111  CO2 22 23 19*  GLUCOSE 113* 95 104*  BUN 21* 12 13  CREATININE 0.98 0.79 0.73  CALCIUM 9.6 9.1 8.9  MG 1.5*  --  1.7   Liver Function Tests  Recent Labs  04/28/16 1410  AST 37  ALT 24  ALKPHOS 82  BILITOT 0.6  PROT 7.3  ALBUMIN 4.4   Cardiac Enzymes  Recent Labs  04/28/16 2322 04/29/16 0441 04/29/16 1104  TROPONINI 1.07* 0.99* 0.74*   Fasting Lipid Panel  Recent Labs  04/29/16 0441  CHOL 175  HDL 52  LDLCALC 104*  TRIG 95  CHOLHDL 3.4    Telemetry    Wondering  atrial rhythm - Personally Reviewed  ECG    Junctional rhythm vs. CHB - Personally Reviewed  Radiology    Dg Chest 2 View  Result Date: 04/28/2016 CLINICAL DATA:  Onset of shortness of breath, body aches, and chills last night. History of hypertension, former smoker. EXAM: CHEST  2 VIEW COMPARISON:  None in PACs FINDINGS: The lungs are adequately inflated. There is no focal infiltrate. There is prominence of the costochondral calcification in the lower ribs bilaterally. There is no pleural effusion or pneumothorax. The heart and pulmonary vascularity are normal. There is calcification within the wall of the tortuous thoracic aorta. The mediastinum is normal in width. The thoracic vertebral bodies are preserved in height. There is multilevel degenerative disc space narrowing. There is moderate to severe degenerative change of both shoulders. IMPRESSION: There is no evidence of pneumonia nor other acute cardiopulmonary abnormality. Thoracic aortic atherosclerosis. Electronically Signed   By: David  Martinique M.D.   On: 04/28/2016 15:09     Cardiac Studies   Coronary Stent Intervention  Left Heart Cath and Coronary Angiography 04/29/16    The left ventricular systolic function is normal.  LV end diastolic pressure is normal.  The left ventricular ejection fraction is 55-65% by visual estimate.  Prox RCA lesion, 40 %stenosed.  A drug eluting stent was successfully placed.  Mid RCA lesion, 85 %stenosed.  Post intervention, there is a 0% residual stenosis.  Dist LAD lesion, 60 %stenosed.  2nd Diag lesion, 70 %stenosed.  Prox Cx lesion, 30 %stenosed.   1. Severe one-vessel coronary artery disease involving mid right coronary artery which has a high anterior takeoff almost from the left coronary cusp. 2. Normal LV systolic function and normal left ventricular end-diastolic pressure. 3. Successful drug-eluting stent placement to the mid right coronary artery.  Recommendations: Dual antiplatelet therapy for at least one year. Aggressive treatment of risk factors. It is not entirely clear if coronary artery disease is fully responsible for the patient's arrhythmia. Monitor overnight and treat as needed.   Patient Profile     Ms. Wintrode is a 80 year old female with a past medical history of HTN. She presented to her PCP's office with fatigue and dizziness. She was found to be in periods of ectopic atrial rhythm and sinus pauses , sent to [facility], then had elevated troponin so was transferred to [facility].   Initial ECG showed ectopic atrial rhythm followed by a pause followed by junctional beats, with other ECG's showing the same. Had left heart cath and got DES to her mid RCA.     Assessment & Plan    1. CAD s/p DES to mid RCA: Will be on DAPT for one year with ASA and Plavix.   Will start high intensity statin.   2. Sinus arrhythmia: Patient has junctional beats and some atrial arrhythmias. Not currently on beta blocker. Need to see how she feels walking around. It is not clear if her RCA disease is  the cause of the arrhythmia. Question whether this will improve as she is revascularized now. Further MD recommendations to follow.   3. Hypokalemia: Resolved.   Signed, Arbutus Leas, NP  04/30/2016, 7:41 AM   I have seen and examined the patient along with Arbutus Leas, NP .  I have reviewed the chart, notes and new data.  I agree with NP's note.  Key new complaints: feels well Key examination changes: radial site healthy. Frequent PACs. Key new findings / data: No AV  block seen, continues to have frequent atrial ectopy, asymptomatic.  PLAN: DC home, f/u with Dr. Bronson Ing. Reinforced need for uninterrupted DAPT for 12 months. Needs to reschedule colonoscopy that was planned this month. Started on statin, target LDL < 70. Stop HCTZ due to severe hypokalemia. Will avoid beta blockers for now due to episodes of bradycardia. Use ARB for BP. Coronary lesion may explain the transient second degree AV block, but not the atrial arrhythmia.  I have not seen evidence for true atrial fibrillation.  Sanda Klein, MD, [place] [phone] 04/30/2016, 9:38 AM

## 2016-04-30 NOTE — Progress Notes (Signed)
CARDIAC REHAB PHASE I   PRE:  Rate/Rhythm: 67 irregular  BP:  Supine: 139/60  Sitting:   Standing:    SaO2: 98%RA  MODE:  Ambulation: 500 ft   POST:  Rate/Rhythm: 78 irregular  BP:  Supine:   Sitting: 150/61  Standing:    SaO2: 96%RA 0845-0940 Pt walked 500 ft with steady gait. Tired by end of walk. No CP. Tolerated well. MI education completed with pt and husband who voiced understanding. Stressed importance of plavix with stent. Reviewed MI restrictions, NTG use, ex ed and heart healthy diet. Discussed CRP 2 and will refer to [facility] program.   Graylon Good, RN BSN  04/30/2016 9:36 AM

## 2016-04-30 NOTE — Care Management Note (Signed)
Case Management Note  Patient Details  Name: Adriana Moyer MRN: [id number] Date of Birth: [birth year]  Subjective/Objective:   S/p coronary cath, will be on plavix,  NCM will cont to follow for dc needs.                  Action/Plan:   Expected Discharge Date:                  Expected Discharge Plan:  Home/Self Care  In-House Referral:     Discharge planning Services  CM Consult  Post Acute Care Choice:    Choice offered to:     DME Arranged:    DME Agency:     HH Arranged:    HH Agency:     Status of Service:  Completed, signed off  If discussed at [employer] of Stay Meetings, dates discussed:    Additional Comments:  Zenon Mayo, RN 04/30/2016, 9:08 AM

## 2016-04-30 NOTE — Progress Notes (Signed)
TR BAND REMOVAL  LOCATION:    right radial  DEFLATED PER PROTOCOL:    Yes.    TIME BAND OFF / DRESSING APPLIED:    21:45   SITE UPON ARRIVAL:    Level 0  SITE AFTER BAND REMOVAL:    Level 0  CIRCULATION SENSATION AND MOVEMENT:    Within Normal Limits   Yes.    COMMENTS:   Post TR band instructions given. Pt tolerated well.

## 2016-05-01 MED FILL — Nitroglycerin IV Soln 100 MCG/ML in D5W: INTRA_ARTERIAL | Qty: 10 | Status: AC

## 2016-05-05 ENCOUNTER — Telehealth: Payer: Self-pay | Admitting: Adult Health

## 2016-05-05 NOTE — Telephone Encounter (Signed)
Would avoid Advil as this is an NSAID. Not recommended in cardiac patients as this can lead to difficult to control hypertension, and affect the kidney function, especially after IV dye from cardiac cath. Use Tylenol instead please

## 2016-05-05 NOTE — Telephone Encounter (Signed)
Spoke with pt, will only use tylenol

## 2016-05-05 NOTE — Telephone Encounter (Signed)
Will forward to NP for dispo

## 2016-05-05 NOTE — Telephone Encounter (Signed)
Patient just had stents placed and is asking if it is okay for her to take Advil. / tg

## 2016-05-12 ENCOUNTER — Ambulatory Visit (INDEPENDENT_AMBULATORY_CARE_PROVIDER_SITE_OTHER): Payer: Self-pay | Admitting: Internal Medicine

## 2016-05-14 ENCOUNTER — Ambulatory Visit (INDEPENDENT_AMBULATORY_CARE_PROVIDER_SITE_OTHER): Payer: Medicare Other | Admitting: Adult Health

## 2016-05-14 ENCOUNTER — Encounter: Payer: Self-pay | Admitting: Adult Health

## 2016-05-14 ENCOUNTER — Other Ambulatory Visit (HOSPITAL_COMMUNITY)
Admission: RE | Admit: 2016-05-14 | Discharge: 2016-05-14 | Disposition: A | Discharge status: Home / Self Care | Payer: Medicare Other | Source: Ambulatory Visit | Attending: Adult Health | Admitting: Adult Health

## 2016-05-14 VITALS — BP 145/80 | HR 86 | Ht 62.0 in | Wt 135.0 lb

## 2016-05-14 DIAGNOSIS — I10 Essential (primary) hypertension: Secondary | ICD-10-CM

## 2016-05-14 DIAGNOSIS — E7800 Pure hypercholesterolemia, unspecified: Secondary | ICD-10-CM | POA: Diagnosis not present

## 2016-05-14 DIAGNOSIS — I2510 Atherosclerotic heart disease of native coronary artery without angina pectoris: Secondary | ICD-10-CM

## 2016-05-14 DIAGNOSIS — Z79899 Other long term (current) drug therapy: Secondary | ICD-10-CM | POA: Insufficient documentation

## 2016-05-14 LAB — CBC WITH DIFFERENTIAL/PLATELET
Basophils Absolute: 0.1 10*3/uL (ref 0.0–0.1)
Basophils Relative: 1 %
EOS ABS: 0.2 10*3/uL (ref 0.0–0.7)
EOS PCT: 2 %
HCT: 32.5 % — ABNORMAL LOW (ref 36.0–46.0)
HEMOGLOBIN: 10.1 g/dL — AB (ref 12.0–15.0)
LYMPHS ABS: 2.1 10*3/uL (ref 0.7–4.0)
Lymphocytes Relative: 27 %
MCH: 25.3 pg — AB (ref 26.0–34.0)
MCHC: 31.1 g/dL (ref 30.0–36.0)
MCV: 81.3 fL (ref 78.0–100.0)
MONO ABS: 0.6 10*3/uL (ref 0.1–1.0)
MONOS PCT: 7 %
Neutro Abs: 4.9 10*3/uL (ref 1.7–7.7)
Neutrophils Relative %: 63 %
Platelets: 428 10*3/uL — ABNORMAL HIGH (ref 150–400)
RBC: 4 MIL/uL (ref 3.87–5.11)
RDW: 14.5 % (ref 11.5–15.5)
WBC: 7.8 10*3/uL (ref 4.0–10.5)

## 2016-05-14 NOTE — Progress Notes (Signed)
Cardiology Office Note   Date:  05/14/2016   ID:  NOON SIPP, DOB [birth year], MRN [id number]  PCP:  Jeri Modena  Cardiologist: Woodroe Chen, NP   Chief Complaint  Patient presents with  . Coronary Artery Disease   History of Present Illness: Adriana Moyer is a 80 y.o. female who presents for posthospitalization follow-up after being admitted for non-ST elevation MI. The patient underwent cardiac catheterization  Left ventricle: The cavity size was normal. Wall thickness was normal. Systolic function was normal. The estimated ejection fraction was in the range of 60% to 65%. Wall motion was normal; there were no regional wall motion abnormalities.  Coronary Stent Intervention  Left Heart Cath and Coronary Angiography 04/29/16   The left ventricular systolic function is normal.  LV end diastolic pressure is normal.  The left ventricular ejection fraction is 55-65% by visual estimate.  Prox RCA lesion, 40 %stenosed.  A drug eluting stent was successfully placed.  Mid RCA lesion, 85 %stenosed.  Post intervention, there is a 0% residual stenosis.  Dist LAD lesion, 60 %stenosed.  2nd Diag lesion, 70 %stenosed.  Prox Cx lesion, 30 %stenosed.  1. Severe one-vessel coronary artery disease involving mid right coronary artery which has a high anterior takeoff almost from the left coronary cusp. 2. Normal LV systolic function and normal left ventricular end-diastolic pressure. 3. Successful drug-eluting stent placement to the mid right coronary artery.  She was placed on dual antiplatelet therapy, continued risk management. Due to bradycardia and an ectopic atrial beats, she was not started on beta blocker during hospitalization. Heart rate on discharge 68 bpm  She comes today with multiple questions for which she has made a list. She has no more complaints of dizziness but has complaints of feeling cold all of the time. She has not  yet been established with cardiac rehab.   Past Medical History:  Diagnosis Date  . Hypertension     Past Surgical History:  Procedure Laterality Date  . ABDOMINAL HYSTERECTOMY    . CARDIAC CATHETERIZATION N/A 04/29/2016   Procedure: Left Heart Cath and Coronary Angiography;  Surgeon: Wellington Hampshire, MD;  Location: [facility];  Service: Cardiovascular;  Laterality: N/A;  . CARDIAC CATHETERIZATION N/A 04/29/2016   Procedure: Coronary Stent Intervention;  Surgeon: Wellington Hampshire, MD;  Location: [facility];  Service: Cardiovascular;  Laterality: N/A;     Current Outpatient Prescriptions  Medication Sig Dispense Refill  . aspirin EC 81 MG EC tablet Take 1 tablet (81 mg total) by mouth daily.    Marland Kitchen atorvastatin (LIPITOR) 40 MG tablet Take 1 tablet (40 mg total) by mouth daily at 6 PM. 30 tablet 12  . clopidogrel (PLAVIX) 75 MG tablet Take 1 tablet (75 mg total) by mouth daily with breakfast. 30 tablet 12  . clotrimazole (LOTRIMIN) 1 % cream Apply 1 application topically daily.    Marland Kitchen losartan (COZAAR) 25 MG tablet Take 1 tablet (25 mg total) by mouth daily. 30 tablet 12  . nitroGLYCERIN (NITROSTAT) 0.4 MG SL tablet Place 1 tablet (0.4 mg total) under the tongue every 5 (five) minutes x 3 doses as needed for chest pain. 25 tablet 4  . pantoprazole (PROTONIX) 40 MG tablet Take 1 tablet (40 mg total) by mouth daily. 30 tablet 12   No current facility-administered medications for this visit.     Allergies:   Patient has no known allergies.    Social History:  The patient  reports that she has quit smoking. She has never used smokeless tobacco. She reports that she does not drink alcohol or use drugs.   Family History:  The patient's family history is not on file.    ROS: All other systems are reviewed and negative. Unless otherwise mentioned in H&P    PHYSICAL EXAM: VS:  BP (!) 145/80   Pulse 86   Ht 5\' 2"  (1.575 m)   Wt 135 lb (61.2 kg)   SpO2 97%   BMI 24.69  kg/m  , BMI Body mass index is 24.69 kg/m. GEN: Well nourished, well developed, in no acute distress  HEENT: normal  Neck: no JVD, carotid bruits, or masses Cardiac: RRR; no murmurs, rubs, or gallops,no edema  Respiratory:  Cear to auscultation bilaterally, normal work of breathing GI: soft, nontender, nondistended, + BS MS: no deformity or atrophy Cath insertion site is well healed.  Skin: warm and dry, no rash Neuro:  Strength and sensation are intact Psych: euthymic mood, full affect   Recent Labs: 04/28/2016: ALT 24 04/30/2016: BUN 13; Creatinine, Ser 0.73; Magnesium 1.7; Potassium 4.4; Sodium 137 05/14/2016: Hemoglobin 10.1; Platelets 428    Lipid Panel    Component Value Date/Time   CHOL 175 04/29/2016 0441   TRIG 95 04/29/2016 0441   HDL 52 04/29/2016 0441   CHOLHDL 3.4 04/29/2016 0441   VLDL 19 04/29/2016 0441   LDLCALC 104 (H) 04/29/2016 0441      Wt Readings from Last 3 Encounters:  05/14/16 135 lb (61.2 kg)  04/30/16 133 lb 9.6 oz (60.6 kg)   ASSESSMENT AND PLAN:  1. CAD: S/P PCI to RCA, with non-obstructive disease elsewhere. She has multiple questions concerning her cardiac cath, results, medications, activity and diet. I have explained all of this to her and to her husband, going over the cath results and giving he a diagram. She has some gum bleeding when she flosses and I have asked her to floss once a day and use moisture mouthwash. I have spent greater than 30 minutes with this patient and her husband answering questions. She will follow up in 3 months with BMET, CBC .She is referred to cardiac rehab. Continue DAPT and ARB.   2. Hypercholesterolemia. Continue statin therapy. Will have follow up labs for cholesterol on next visit.    Current medicines are reviewed at length with the patient today.    Labs/ tests ordered today include:   Orders Placed This Encounter  Procedures  . CBC with Differential  . Basic Metabolic Panel (BMET)      Disposition:   FU with  3 months.  Signed, Jory Sims, NP  05/14/2016 5:27 PM    [facility] [address], [place], [place] [address] Phone: [phone]; Fax: [phone]

## 2016-05-14 NOTE — Patient Instructions (Signed)
Your physician recommends that you schedule a follow-up appointment in: 3 Months with Dr. Dwana Curd  Your physician recommends that you continue on your current medications as directed. Please refer to the Current Medication list given to you today.  Your physician recommends that you return for lab work  If you need a refill on your cardiac medications before your next appointment, please call your pharmacy.  Thank you for choosing [facility]!

## 2016-05-14 NOTE — Progress Notes (Signed)
Name: Adriana Moyer    DOB: Apr 07, 1936  Age: 80 y.o.  MR#: [id number]       PCP:  Jeri Modena      Insurance: Payor: [employer] MEDICARE / Plan: [employer]HC MEDICARE / Product Type: *No Product type* /   CC:   No chief complaint on file.   VS Vitals:   05/14/16 1426  Pulse: 86  SpO2: 97%  Weight: 135 lb (61.2 kg)  Height: 5\' 2"  (1.575 m)    Weights Current Weight  05/14/16 135 lb (61.2 kg)  04/30/16 133 lb 9.6 oz (60.6 kg)    Blood Pressure  BP Readings from Last 3 Encounters:  04/30/16 (!) 150/61     Admit date:  (Not on file) Last encounter with RMR:  05/05/2016   Allergy Patient has no known allergies.  Current Outpatient Prescriptions  Medication Sig Dispense Refill  . aspirin EC 81 MG EC tablet Take 1 tablet (81 mg total) by mouth daily.    Marland Kitchen atorvastatin (LIPITOR) 40 MG tablet Take 1 tablet (40 mg total) by mouth daily at 6 PM. 30 tablet 12  . clopidogrel (PLAVIX) 75 MG tablet Take 1 tablet (75 mg total) by mouth daily with breakfast. 30 tablet 12  . clotrimazole (LOTRIMIN) 1 % cream Apply 1 application topically daily.    Marland Kitchen losartan (COZAAR) 25 MG tablet Take 1 tablet (25 mg total) by mouth daily. 30 tablet 12  . nitroGLYCERIN (NITROSTAT) 0.4 MG SL tablet Place 1 tablet (0.4 mg total) under the tongue every 5 (five) minutes x 3 doses as needed for chest pain. 25 tablet 4  . pantoprazole (PROTONIX) 40 MG tablet Take 1 tablet (40 mg total) by mouth daily. 30 tablet 12   No current facility-administered medications for this visit.     Discontinued Meds:   There are no discontinued medications.  Patient Active Problem List   Diagnosis Date Noted  . NSTEMI (non-ST elevated myocardial infarction) ([facility]) 04/28/2016    LABS    Component Value Date/Time   NA 137 04/30/2016 0300   NA 139 04/29/2016 0441   NA 134 (L) 04/28/2016 1410   K 4.4 04/30/2016 0300   K 3.4 (L) 04/29/2016 0441   K 2.7 (LL) 04/28/2016 1410   CL 111 04/30/2016 0300   CL 109  04/29/2016 0441   CL 100 (L) 04/28/2016 1410   CO2 19 (L) 04/30/2016 0300   CO2 23 04/29/2016 0441   CO2 22 04/28/2016 1410   GLUCOSE 104 (H) 04/30/2016 0300   GLUCOSE 95 04/29/2016 0441   GLUCOSE 113 (H) 04/28/2016 1410   BUN 13 04/30/2016 0300   BUN 12 04/29/2016 0441   BUN 21 (H) 04/28/2016 1410   CREATININE 0.73 04/30/2016 0300   CREATININE 0.79 04/29/2016 0441   CREATININE 0.98 04/28/2016 1410   CALCIUM 8.9 04/30/2016 0300   CALCIUM 9.1 04/29/2016 0441   CALCIUM 9.6 04/28/2016 1410   GFRNONAA >60 04/30/2016 0300   GFRNONAA >60 04/29/2016 0441   GFRNONAA 54 (L) 04/28/2016 1410   GFRAA >60 04/30/2016 0300   GFRAA >60 04/29/2016 0441   GFRAA >60 04/28/2016 1410   CMP     Component Value Date/Time   NA 137 04/30/2016 0300   K 4.4 04/30/2016 0300   CL 111 04/30/2016 0300   CO2 19 (L) 04/30/2016 0300   GLUCOSE 104 (H) 04/30/2016 0300   BUN 13 04/30/2016 0300   CREATININE 0.73 04/30/2016 0300   CALCIUM 8.9 04/30/2016 0300  PROT 7.3 04/28/2016 1410   ALBUMIN 4.4 04/28/2016 1410   AST 37 04/28/2016 1410   ALT 24 04/28/2016 1410   ALKPHOS 82 04/28/2016 1410   BILITOT 0.6 04/28/2016 1410   GFRNONAA >60 04/30/2016 0300   GFRAA >60 04/30/2016 0300       Component Value Date/Time   WBC 9.3 04/30/2016 0300   WBC 7.4 04/29/2016 0441   WBC 12.0 (H) 04/28/2016 1410   HGB 9.5 (L) 04/30/2016 0300   HGB 9.7 (L) 04/29/2016 0441   HGB 10.7 (L) 04/28/2016 1410   HCT 30.0 (L) 04/30/2016 0300   HCT 30.8 (L) 04/29/2016 0441   HCT 33.4 (L) 04/28/2016 1410   MCV 79.6 04/30/2016 0300   MCV 78.2 04/29/2016 0441   MCV 79.7 04/28/2016 1410    Lipid Panel     Component Value Date/Time   CHOL 175 04/29/2016 0441   TRIG 95 04/29/2016 0441   HDL 52 04/29/2016 0441   CHOLHDL 3.4 04/29/2016 0441   VLDL 19 04/29/2016 0441   LDLCALC 104 (H) 04/29/2016 0441    ABG No results found for: PHART, PCO2ART, PO2ART, HCO3, TCO2, ACIDBASEDEF, O2SAT   No results found for: TSH BNP (last  3 results) No results for input(s): BNP in the last 8760 hours.  ProBNP (last 3 results) No results for input(s): PROBNP in the last 8760 hours.  Cardiac Panel (last 3 results) No results for input(s): CKTOTAL, CKMB, TROPONINI, RELINDX in the last 72 hours.  Iron/TIBC/Ferritin/ %Sat No results found for: IRON, TIBC, FERRITIN, IRONPCTSAT   EKG Orders placed or performed during the hospital encounter of 04/28/16  . ED EKG  . EKG 12-Lead  . EKG 12-Lead  . Repeat EKG  . Repeat EKG  . EKG 12-Lead  . EKG 12-Lead  . EKG 12-Lead  . EKG 12-Lead  . EKG 12-Lead  . EKG 12-Lead  . EKG 12-Lead immediately post procedure  . EKG 12-Lead  . EKG 12-Lead immediately post procedure  . EKG 12-Lead  . EKG 12-Lead  . EKG     Prior Assessment and Plan Problem List as of 05/14/2016 Never Reviewed     Cardiovascular and Mediastinum   NSTEMI (non-ST elevated myocardial infarction) [facility])       Imaging: Dg Chest 2 View  Result Date: 04/28/2016 CLINICAL DATA:  Onset of shortness of breath, body aches, and chills last night. History of hypertension, former smoker. EXAM: CHEST  2 VIEW COMPARISON:  None in PACs FINDINGS: The lungs are adequately inflated. There is no focal infiltrate. There is prominence of the costochondral calcification in the lower ribs bilaterally. There is no pleural effusion or pneumothorax. The heart and pulmonary vascularity are normal. There is calcification within the wall of the tortuous thoracic aorta. The mediastinum is normal in width. The thoracic vertebral bodies are preserved in height. There is multilevel degenerative disc space narrowing. There is moderate to severe degenerative change of both shoulders. IMPRESSION: There is no evidence of pneumonia nor other acute cardiopulmonary abnormality. Thoracic aortic atherosclerosis. Electronically Signed   By: David  Martinique M.D.   On: 04/28/2016 15:09

## 2016-05-26 ENCOUNTER — Telehealth: Payer: Self-pay | Admitting: Cardiovascular Disease

## 2016-05-26 DIAGNOSIS — R040 Epistaxis: Secondary | ICD-10-CM | POA: Diagnosis not present

## 2016-05-26 NOTE — Telephone Encounter (Signed)
Patient stated that she woke up this am with the taste of blood in her mouth,spit out a small amount of red blood.Examined gums and they were not bleeding.Denies coughing up blood.Then she had second episode of pink blood coming up in her mouth that she she spit into a tissue.Said she has some diarrhea but it is brown in color.Denies abdominal pain.I advised her to call her pcp and speak with them.She had a normal colonoscopy 7 yrs ago and had a another  one scheduled but cancelled after her heart issuses.   I told her I will call her back after K lawrence NP reviews,pt agreed with dispo

## 2016-05-26 NOTE — Telephone Encounter (Signed)
Pt made aware that there were no changes for now. Also reminded her to floss teeth only once daily  and use the mouth wash. She stated that she was not so sure it was form flossing, but she would keep that in mind. She voiced understanding.

## 2016-05-26 NOTE — Telephone Encounter (Signed)
As long as she is not actively bleeding would not change anything. On office visit she states that she flosses her teeth several times a day. I have advised her to floss only once a day. I also advised her to use Biotene moisture mouth wash.

## 2016-05-26 NOTE — Telephone Encounter (Signed)
Pt called stating she has spit up a good amount of blood this morning, would like to know if it's due to her meds. Please give her a call @ [phone]

## 2016-07-14 ENCOUNTER — Other Ambulatory Visit: Payer: Self-pay | Admitting: *Deleted

## 2016-07-14 MED ORDER — FAMOTIDINE 40 MG PO TABS: 40.0000 mg | ORAL_TABLET | Freq: Every day | ORAL | 11 refills | Status: DC

## 2016-07-16 ENCOUNTER — Telehealth: Payer: Self-pay | Admitting: Cardiovascular Disease

## 2016-07-16 NOTE — Telephone Encounter (Signed)
Pt would like someone to give her a call to discuss all of her medication changes

## 2016-07-16 NOTE — Telephone Encounter (Signed)
Unable to reach, LMTCB-cc

## 2016-07-17 DIAGNOSIS — I10 Essential (primary) hypertension: Secondary | ICD-10-CM | POA: Diagnosis not present

## 2016-07-17 DIAGNOSIS — K219 Gastro-esophageal reflux disease without esophagitis: Secondary | ICD-10-CM | POA: Diagnosis not present

## 2016-07-17 DIAGNOSIS — Z955 Presence of coronary angioplasty implant and graft: Secondary | ICD-10-CM | POA: Diagnosis not present

## 2016-07-17 DIAGNOSIS — E782 Mixed hyperlipidemia: Secondary | ICD-10-CM | POA: Diagnosis not present

## 2016-07-17 DIAGNOSIS — I2510 Atherosclerotic heart disease of native coronary artery without angina pectoris: Secondary | ICD-10-CM | POA: Diagnosis not present

## 2016-07-21 NOTE — Telephone Encounter (Signed)
Understands that her PPI was changed to pepcid as she is on Plavix by NP

## 2016-08-06 ENCOUNTER — Other Ambulatory Visit: Payer: Self-pay

## 2016-08-06 MED ORDER — FAMOTIDINE 40 MG PO TABS: 40.0000 mg | ORAL_TABLET | Freq: Every day | ORAL | 11 refills | Status: DC

## 2016-08-06 MED ORDER — ATORVASTATIN CALCIUM 40 MG PO TABS: 40.0000 mg | ORAL_TABLET | Freq: Every day | ORAL | 12 refills | Status: DC

## 2016-08-06 MED ORDER — LOSARTAN POTASSIUM 25 MG PO TABS: 25.0000 mg | ORAL_TABLET | Freq: Every day | ORAL | 12 refills | Status: DC

## 2016-08-12 ENCOUNTER — Encounter: Payer: Self-pay | Admitting: *Deleted

## 2016-08-12 ENCOUNTER — Ambulatory Visit (INDEPENDENT_AMBULATORY_CARE_PROVIDER_SITE_OTHER): Payer: Medicare Other | Admitting: Cardiovascular Disease

## 2016-08-12 VITALS — BP 145/68 | HR 69 | Ht 62.0 in | Wt 132.0 lb

## 2016-08-12 DIAGNOSIS — E7800 Pure hypercholesterolemia, unspecified: Secondary | ICD-10-CM

## 2016-08-12 DIAGNOSIS — I252 Old myocardial infarction: Secondary | ICD-10-CM | POA: Diagnosis not present

## 2016-08-12 DIAGNOSIS — R0989 Other specified symptoms and signs involving the circulatory and respiratory systems: Secondary | ICD-10-CM | POA: Diagnosis not present

## 2016-08-12 DIAGNOSIS — I10 Essential (primary) hypertension: Secondary | ICD-10-CM

## 2016-08-12 DIAGNOSIS — I25118 Atherosclerotic heart disease of native coronary artery with other forms of angina pectoris: Secondary | ICD-10-CM | POA: Diagnosis not present

## 2016-08-12 DIAGNOSIS — Z955 Presence of coronary angioplasty implant and graft: Secondary | ICD-10-CM | POA: Diagnosis not present

## 2016-08-12 MED ORDER — LOSARTAN POTASSIUM 25 MG PO TABS: 25.0000 mg | ORAL_TABLET | Freq: Every day | ORAL | 3 refills | Status: DC

## 2016-08-12 MED ORDER — ATORVASTATIN CALCIUM 40 MG PO TABS: 40.0000 mg | ORAL_TABLET | Freq: Every day | ORAL | 3 refills | Status: DC

## 2016-08-12 MED ORDER — FAMOTIDINE 40 MG PO TABS: 40.0000 mg | ORAL_TABLET | Freq: Every day | ORAL | 3 refills | Status: DC

## 2016-08-12 MED ORDER — CLOPIDOGREL BISULFATE 75 MG PO TABS: 75.0000 mg | ORAL_TABLET | Freq: Every day | ORAL | 3 refills | Status: DC

## 2016-08-12 NOTE — Addendum Note (Signed)
Addended by: Laurine Blazer on: 08/12/2016 09:27 AM   Modules accepted: Orders

## 2016-08-12 NOTE — Progress Notes (Signed)
SUBJECTIVE: The patient presents for routine follow up. She sustained a NSTEMI and underwent DES to the mid RCA on 04/29/16. She has been walking for 30 minutes daily on a track or at [employer]. She denies exertional chest pain and shortness of breath. She denies dizziness and bleeding problems. She said her husband tells her she snores a lot. She does not wake up with morning headaches or a sense of feeling fatigued. She does complain of acid reflux in the afternoons. She takes Pepcid in the morning.  Coronary angiography details:   The left ventricular systolic function is normal.  LV end diastolic pressure is normal.  The left ventricular ejection fraction is 55-65% by visual estimate.  Prox RCA lesion, 40 %stenosed.  A drug eluting stent was successfully placed.  Mid RCA lesion, 85 %stenosed.  Post intervention, there is a 0% residual stenosis.  Dist LAD lesion, 60 %stenosed.  2nd Diag lesion, 70 %stenosed.  Prox Cx lesion, 30 %stenosed.   1. Severe one-vessel coronary artery disease involving mid right coronary artery which has a high anterior takeoff almost from the left coronary cusp. 2. Normal LV systolic function and normal left ventricular end-diastolic pressure. 3. Successful drug-eluting stent placement to the mid right coronary artery.   Review of Systems: As per "subjective", otherwise negative.  No Known Allergies  Current Outpatient Prescriptions  Medication Sig Dispense Refill  . aspirin EC 81 MG EC tablet Take 1 tablet (81 mg total) by mouth daily.    Marland Kitchen atorvastatin (LIPITOR) 40 MG tablet Take 1 tablet (40 mg total) by mouth daily at 6 PM. 30 tablet 12  . clopidogrel (PLAVIX) 75 MG tablet Take 1 tablet (75 mg total) by mouth daily with breakfast. 30 tablet 12  . clotrimazole (LOTRIMIN) 1 % cream Apply 1 application topically daily.    . famotidine (PEPCID) 40 MG tablet Take 1 tablet (40 mg total) by mouth daily. 30 tablet 11  . losartan (COZAAR) 25 MG  tablet Take 1 tablet (25 mg total) by mouth daily. 30 tablet 12  . nitroGLYCERIN (NITROSTAT) 0.4 MG SL tablet Place 1 tablet (0.4 mg total) under the tongue every 5 (five) minutes x 3 doses as needed for chest pain. 25 tablet 4   No current facility-administered medications for this visit.     Past Medical History:  Diagnosis Date  . Hypertension   . NSTEMI (non-ST elevated myocardial infarction) [facility])     Past Surgical History:  Procedure Laterality Date  . ABDOMINAL HYSTERECTOMY    . CARDIAC CATHETERIZATION N/A 04/29/2016   Procedure: Left Heart Cath and Coronary Angiography;  Surgeon: Wellington Hampshire, MD;  Location: [place] [facility];  Service: Cardiovascular;  Laterality: N/A;  . CARDIAC CATHETERIZATION N/A 04/29/2016   Procedure: Coronary Stent Intervention;  Surgeon: Wellington Hampshire, MD;  Location: [place] [facility];  Service: Cardiovascular;  Laterality: N/A;    Social History   Social History  . Marital status: Married    Spouse name: N/A  . Number of children: N/A  . Years of education: N/A   Occupational History  . Not on file.   Social History Main Topics  . Smoking status: Former Smoker    Packs/day: 0.75    Years: 14.00    Types: Cigarettes    Start date: 09/17/1950    Quit date: 05/25/1966  . Smokeless tobacco: Never Used  . Alcohol use No  . Drug use: No  . Sexual activity: Not  on file   Other Topics Concern  . Not on file   Social History Narrative  . No narrative on file     Vitals:   08/12/16 0821  BP: (!) 145/68  Pulse: 69  Weight: 132 lb (59.9 kg)  Height: 5\' 2"  (1.575 m)    PHYSICAL EXAM General: NAD HEENT: Normal. Neck: No JVD, no thyromegaly. Lungs: Clear to auscultation bilaterally with normal respiratory effort. CV: Nondisplaced PMI.  Regular rate and rhythm, normal S1/S2, no S3/S4, no murmur. No pretibial or periankle edema.  Bilateral carotid bruits, right>left. Abdomen: Soft, nontender, no distention.  Neurologic: Alert and  oriented.  Psych: Normal affect. Skin: Normal. Musculoskeletal: No gross deformities.    ECG: Most recent ECG reviewed.      ASSESSMENT AND PLAN: 1. CAD with h/o NSTEMI and mid-RCA drug eluting stent placement 04/2016: Symptomatically stable. Continue double antiplatelet therapy with aspirin and Plavix for a minimum of 1 year. Continue losartan and Lipitor. Beta blockers were not initiated due to junctional arrhythmia and bradycardia while hospitalized. Will hold off for now.  2. Hypertension: Elevated. Will monitor.  3. Hyperlipidemia: Continue Lipitor. LDL 104, TC 175, TG 95 on 04/29/16. Will need to be repeated in the near future.  4. GERD: Instructed to take Zantac every afternoon as needed.  5. Bilateral carotid bruits: Will obtain Dopplers.  Dispo: fu 4 months.   Kate Sable, M.D., F.A.C.C.

## 2016-08-12 NOTE — Patient Instructions (Signed)
Medication Instructions:  Continue all current medications.  Labwork: none  Testing/Procedures:  Your physician has requested that you have a carotid duplex. This test is an ultrasound of the carotid arteries in your neck. It looks at blood flow through these arteries that supply the brain with blood. Allow one hour for this exam. There are no restrictions or special instructions.  Office will contact with results via phone or letter.    Follow-Up: Your physician wants you to follow up in:  4 months.  You will receive a reminder letter in the mail one-two months in advance.  If you don't receive a letter, please call our office to schedule the follow up appointment   Any Other Special Instructions Will Be Listed Below (If Applicable).  If you need a refill on your cardiac medications before your next appointment, please call your pharmacy.

## 2016-08-13 ENCOUNTER — Telehealth: Payer: Self-pay | Admitting: Cardiovascular Disease

## 2016-08-13 NOTE — Telephone Encounter (Signed)
Pre-cert Verification for the following procedure   Cartoid scheduled for 08/26/16 [facility]

## 2016-08-26 ENCOUNTER — Ambulatory Visit: Payer: Medicare Other

## 2016-08-26 DIAGNOSIS — R0989 Other specified symptoms and signs involving the circulatory and respiratory systems: Secondary | ICD-10-CM

## 2016-08-27 LAB — VAS US CAROTID
LCCADDIAS: 23 cm/s
LCCADSYS: 108 cm/s
LCCAPDIAS: 16 cm/s
LCCAPSYS: 139 cm/s
LEFT ECA DIAS: -15 cm/s
LEFT VERTEBRAL DIAS: 13 cm/s
LICADDIAS: -49 cm/s
LICADSYS: -169 cm/s
LICAPSYS: 169 cm/s
Left ICA prox dias: 29 cm/s
RCCAPSYS: 162 cm/s
RIGHT ECA DIAS: 0 cm/s
RIGHT VERTEBRAL DIAS: -22 cm/s
Right CCA prox dias: 26 cm/s
Right cca dist sys: -152 cm/s

## 2016-08-28 ENCOUNTER — Telehealth: Payer: Self-pay | Admitting: *Deleted

## 2016-08-28 NOTE — Telephone Encounter (Signed)
Notes recorded by Laurine Blazer, LPN on 06/28/4973 at 3:00 PM EDT Patient notified and verbalized understanding. Copy to pmd. ------  Notes recorded by Herminio Commons, MD on 08/27/2016 at 12:21 PM EDT Mild bilateral disease. Repeat in 2 years.

## 2016-08-29 DIAGNOSIS — R300 Dysuria: Secondary | ICD-10-CM | POA: Diagnosis not present

## 2016-08-29 DIAGNOSIS — Z6824 Body mass index (BMI) 24.0-24.9, adult: Secondary | ICD-10-CM | POA: Diagnosis not present

## 2016-09-03 DIAGNOSIS — Z6823 Body mass index (BMI) 23.0-23.9, adult: Secondary | ICD-10-CM | POA: Diagnosis not present

## 2016-09-03 DIAGNOSIS — J209 Acute bronchitis, unspecified: Secondary | ICD-10-CM | POA: Diagnosis not present

## 2016-09-03 DIAGNOSIS — J019 Acute sinusitis, unspecified: Secondary | ICD-10-CM | POA: Diagnosis not present

## 2016-10-09 DIAGNOSIS — Z6823 Body mass index (BMI) 23.0-23.9, adult: Secondary | ICD-10-CM | POA: Diagnosis not present

## 2016-10-09 DIAGNOSIS — W57XXXA Bitten or stung by nonvenomous insect and other nonvenomous arthropods, initial encounter: Secondary | ICD-10-CM | POA: Diagnosis not present

## 2016-10-09 DIAGNOSIS — L0390 Cellulitis, unspecified: Secondary | ICD-10-CM | POA: Diagnosis not present

## 2017-02-15 DIAGNOSIS — Z23 Encounter for immunization: Secondary | ICD-10-CM | POA: Diagnosis not present

## 2017-02-25 ENCOUNTER — Encounter: Payer: Self-pay | Admitting: Cardiovascular Disease

## 2017-02-25 ENCOUNTER — Ambulatory Visit (INDEPENDENT_AMBULATORY_CARE_PROVIDER_SITE_OTHER): Payer: Medicare Other | Admitting: Cardiovascular Disease

## 2017-02-25 VITALS — BP 138/78 | HR 77 | Ht 62.0 in | Wt 125.0 lb

## 2017-02-25 DIAGNOSIS — K219 Gastro-esophageal reflux disease without esophagitis: Secondary | ICD-10-CM | POA: Diagnosis not present

## 2017-02-25 DIAGNOSIS — I25118 Atherosclerotic heart disease of native coronary artery with other forms of angina pectoris: Secondary | ICD-10-CM

## 2017-02-25 DIAGNOSIS — I6523 Occlusion and stenosis of bilateral carotid arteries: Secondary | ICD-10-CM

## 2017-02-25 DIAGNOSIS — I252 Old myocardial infarction: Secondary | ICD-10-CM

## 2017-02-25 DIAGNOSIS — Z955 Presence of coronary angioplasty implant and graft: Secondary | ICD-10-CM | POA: Diagnosis not present

## 2017-02-25 DIAGNOSIS — I10 Essential (primary) hypertension: Secondary | ICD-10-CM | POA: Diagnosis not present

## 2017-02-25 DIAGNOSIS — E7800 Pure hypercholesterolemia, unspecified: Secondary | ICD-10-CM

## 2017-02-25 MED ORDER — PANTOPRAZOLE SODIUM 40 MG PO TBEC: 40.0000 mg | DELAYED_RELEASE_TABLET | Freq: Every day | ORAL | 6 refills | Status: DC

## 2017-02-25 NOTE — Progress Notes (Signed)
SUBJECTIVE: The patient presents for routine follow up. She sustained a NSTEMI and underwent DES to the mid RCA on 04/29/16.  Carotid Dopplers in April 2018 demonstrated mild bilateral disease.  She had been feeling well until a week ago when she was awoken in the middle of the night with significant chest heaviness. She waited about 2 or 3 minutes and did not take nitroglycerin. It resolved on its own. She had been doing a lot of yardwork earlier in the day. She likes to stay active. She had been walking 1 mile daily and now walks 1 mile 5 days per week.  She says she has "bad acid reflux ". She takes Pepcid daily and Zantac occasionally.  Soc Hx: She is originally from [place] and then lived in [place] for several years. She is married and has one 69 year old son who lives in [place].    Review of Systems: As per "subjective", otherwise negative.  No Known Allergies  Current Outpatient Prescriptions  Medication Sig Dispense Refill  . aspirin EC 81 MG EC tablet Take 1 tablet (81 mg total) by mouth daily.    Marland Kitchen atorvastatin (LIPITOR) 40 MG tablet Take 1 tablet (40 mg total) by mouth daily at 6 PM. 90 tablet 3  . clopidogrel (PLAVIX) 75 MG tablet Take 1 tablet (75 mg total) by mouth daily with breakfast. 90 tablet 3  . clotrimazole (LOTRIMIN) 1 % cream Apply 1 application topically daily.    . Diphenhydramine-APAP, sleep, (TYLENOL PM EXTRA STRENGTH PO) Take by mouth at bedtime.    . famotidine (PEPCID) 40 MG tablet Take 1 tablet (40 mg total) by mouth daily. 90 tablet 3  . losartan (COZAAR) 25 MG tablet Take 1 tablet (25 mg total) by mouth daily. 90 tablet 3  . nitroGLYCERIN (NITROSTAT) 0.4 MG SL tablet Place 1 tablet (0.4 mg total) under the tongue every 5 (five) minutes x 3 doses as needed for chest pain. 25 tablet 4  . ranitidine (ZANTAC) 150 MG tablet Take 150 mg by mouth as needed for heartburn.     No current facility-administered medications for this visit.     Past  Medical History:  Diagnosis Date  . Hypertension   . NSTEMI (non-ST elevated myocardial infarction) [facility])     Past Surgical History:  Procedure Laterality Date  . ABDOMINAL HYSTERECTOMY    . CARDIAC CATHETERIZATION N/A 04/29/2016   Procedure: Left Heart Cath and Coronary Angiography;  Surgeon: Wellington Hampshire, MD;  Location: [place] [facility];  Service: Cardiovascular;  Laterality: N/A;  . CARDIAC CATHETERIZATION N/A 04/29/2016   Procedure: Coronary Stent Intervention;  Surgeon: Wellington Hampshire, MD;  Location: [place] [facility];  Service: Cardiovascular;  Laterality: N/A;    Social History   Social History  . Marital status: Married    Spouse name: N/A  . Number of children: N/A  . Years of education: N/A   Occupational History  . Not on file.   Social History Main Topics  . Smoking status: Former Smoker    Packs/day: 0.75    Years: 14.00    Types: Cigarettes    Start date: 09/17/1950    Quit date: 05/25/1966  . Smokeless tobacco: Never Used  . Alcohol use No  . Drug use: No  . Sexual activity: Not on file   Other Topics Concern  . Not on file   Social History Narrative  . No narrative on file     Vitals:  02/25/17 1123  BP: 138/78  Pulse: 77  SpO2: 98%  Weight: 125 lb (56.7 kg)  Height: 5\' 2"  (1.575 m)    Wt Readings from Last 3 Encounters:  02/25/17 125 lb (56.7 kg)  08/12/16 132 lb (59.9 kg)  05/14/16 135 lb (61.2 kg)     PHYSICAL EXAM General: NAD HEENT: Normal. Neck: No JVD, no thyromegaly. Lungs: Clear to auscultation bilaterally with normal respiratory effort. CV: Nondisplaced PMI.  Regular rate and rhythm, normal S1/S2, no S3/S4, no murmur. + chest wall tenderness. No pretibial or periankle edema. Bilateral carotid bruits, right>left. Abdomen: Soft, nontender, no distention.  Neurologic: Alert and oriented.  Psych: Normal affect. Skin: Normal. Musculoskeletal: No gross deformities.    ECG: Most recent ECG reviewed.   Labs: Lab  Results  Component Value Date/Time   K 4.4 04/30/2016 03:00 AM   BUN 13 04/30/2016 03:00 AM   CREATININE 0.73 04/30/2016 03:00 AM   ALT 24 04/28/2016 02:10 PM   HGB 10.1 (L) 05/14/2016 03:44 PM     Lipids: Lab Results  Component Value Date/Time   LDLCALC 104 (H) 04/29/2016 04:41 AM   CHOL 175 04/29/2016 04:41 AM   TRIG 95 04/29/2016 04:41 AM   HDL 52 04/29/2016 04:41 AM       ASSESSMENT AND PLAN:  1. CAD with h/o NSTEMI and mid-RCA drug eluting stent placement 04/2016: Symptomatically stable. Continue double antiplatelet therapy with aspirin and Plavix for a minimum of 1 year. Continue losartan and Lipitor. Beta blockers were not initiated due to junctional arrhythmia and bradycardia while hospitalized. Will hold off for now.  2. Hypertension: Controlled. No changes to therapy.  3. Hyperlipidemia: Continue Lipitor. LDL 104, TC 175, TG 95 on 04/29/16.  I will repeat lipids.  4. GERD: She has significant acid reflux disease unresponsive to Pepcid and Zantac. I will discontinue both of these and start Protonix 40 mg daily.  5. Bilateral carotid artery disease: Mild disease bilaterally in April 2018. I will repeat carotid Dopplers in 2-3 years.      Disposition: Follow up 3 months.   Kate Sable, M.D., F.A.C.C.

## 2017-02-25 NOTE — Addendum Note (Signed)
Addended by: Laurine Blazer on: 02/25/2017 11:51 AM   Modules accepted: Orders

## 2017-02-25 NOTE — Patient Instructions (Signed)
Medication Instructions:   Stop Pepcid.  Stop Zantac.   Begin Protonix 40mg  daily.  Continue all other medications.    Labwork: none  Testing/Procedures: none  Follow-Up: 3 months   Any Other Special Instructions Will Be Listed Below (If Applicable).  If you need a refill on your cardiac medications before your next appointment, please call your pharmacy.

## 2017-02-26 ENCOUNTER — Telehealth: Payer: Self-pay | Admitting: Cardiovascular Disease

## 2017-02-26 NOTE — Telephone Encounter (Signed)
Pt says she was mistaken at 10/4 appt and has actually been taking protonix 40 mg daily for the last 5 months and says this doesn't help. Wanted to know if Dr Bronson Ing wanted her to try something else

## 2017-02-26 NOTE — Telephone Encounter (Signed)
Patient was seen in office 02-25-17. She has questions regarding the Protonix 40 mg daily.

## 2017-02-26 NOTE — Telephone Encounter (Signed)
Pt agreeable. Updated medication list

## 2017-02-26 NOTE — Telephone Encounter (Signed)
The first step would be to increase it to twice daily dosing.

## 2017-04-18 ENCOUNTER — Emergency Department (HOSPITAL_COMMUNITY)
Admission: EM | Admit: 2017-04-18 | Discharge: 2017-04-18 | Disposition: A | Discharge status: Home / Self Care | Payer: Medicare Other | Attending: Emergency Medicine | Admitting: Emergency Medicine

## 2017-04-18 ENCOUNTER — Emergency Department (HOSPITAL_COMMUNITY): Payer: Medicare Other

## 2017-04-18 ENCOUNTER — Encounter (HOSPITAL_COMMUNITY): Payer: Self-pay

## 2017-04-18 DIAGNOSIS — Y9301 Activity, walking, marching and hiking: Secondary | ICD-10-CM | POA: Diagnosis not present

## 2017-04-18 DIAGNOSIS — W19XXXA Unspecified fall, initial encounter: Secondary | ICD-10-CM

## 2017-04-18 DIAGNOSIS — Z79899 Other long term (current) drug therapy: Secondary | ICD-10-CM | POA: Insufficient documentation

## 2017-04-18 DIAGNOSIS — Y929 Unspecified place or not applicable: Secondary | ICD-10-CM | POA: Diagnosis not present

## 2017-04-18 DIAGNOSIS — Z7982 Long term (current) use of aspirin: Secondary | ICD-10-CM | POA: Diagnosis not present

## 2017-04-18 DIAGNOSIS — I10 Essential (primary) hypertension: Secondary | ICD-10-CM | POA: Diagnosis not present

## 2017-04-18 DIAGNOSIS — R51 Headache: Secondary | ICD-10-CM | POA: Diagnosis not present

## 2017-04-18 DIAGNOSIS — Z87891 Personal history of nicotine dependence: Secondary | ICD-10-CM | POA: Insufficient documentation

## 2017-04-18 DIAGNOSIS — Y999 Unspecified external cause status: Secondary | ICD-10-CM | POA: Diagnosis not present

## 2017-04-18 DIAGNOSIS — S0990XA Unspecified injury of head, initial encounter: Secondary | ICD-10-CM | POA: Diagnosis not present

## 2017-04-18 DIAGNOSIS — S0083XA Contusion of other part of head, initial encounter: Secondary | ICD-10-CM | POA: Diagnosis not present

## 2017-04-18 DIAGNOSIS — W010XXA Fall on same level from slipping, tripping and stumbling without subsequent striking against object, initial encounter: Secondary | ICD-10-CM | POA: Diagnosis not present

## 2017-04-18 MED ORDER — ACETAMINOPHEN 325 MG PO TABS
650.0000 mg | ORAL_TABLET | Freq: Once | ORAL | Status: AC
  Administered 2017-04-18: 650 mg via ORAL
  Filled 2017-04-18: qty 2

## 2017-04-18 NOTE — ED Triage Notes (Addendum)
Patient reports of tripping over rug this afternoon and hit head on cement. Swelling and bruising noted to right side of head. Denies loc. Patient is on blood thinners. Patient in on blood thinner. BP 225/141 in triage. Denies any complaints of dizziness or visual changes.

## 2017-04-18 NOTE — Discharge Instructions (Signed)
Take Tylenol every 4 hours as needed for headache.  Place an ice pack on the swollen area 4 times daily for 30 minutes at a time.  Your blood pressure was elevated today at 170 over

## 2017-04-18 NOTE — ED Provider Notes (Signed)
[facility] EMERGENCY DEPARTMENT Provider Note   CSN: [id number] Arrival date & time: 04/18/17  1705     History   Chief Complaint Chief Complaint  Patient presents with  . Head Injury    HPI Adriana Moyer is a 81 y.o. female.  Patient tripped and fell 2 PM today striking her head.  She denies any loss of consciousness.  She has a mild headache at the right temporal area where she struck her head as well as swelling at that area.  Denies any neck pain denies loss of consciousness denies visual changes.  No other associated symptoms.  No treatment prior to coming here she felt well prior to the fall today  HPI  Past Medical History:  Diagnosis Date  . Hypertension   . NSTEMI (non-ST elevated myocardial infarction) [facility])     Patient Active Problem List   Diagnosis Date Noted  . NSTEMI (non-ST elevated myocardial infarction) ([place]) 04/28/2016    Past Surgical History:  Procedure Laterality Date  . ABDOMINAL HYSTERECTOMY    . CARDIAC CATHETERIZATION N/A 04/29/2016   Procedure: Left Heart Cath and Coronary Angiography;  Surgeon: Wellington Hampshire, MD;  Location: [place] [facility];  Service: Cardiovascular;  Laterality: N/A;  . CARDIAC CATHETERIZATION N/A 04/29/2016   Procedure: Coronary Stent Intervention;  Surgeon: Wellington Hampshire, MD;  Location: [place] [facility];  Service: Cardiovascular;  Laterality: N/A;    OB History    No data available       Home Medications    Prior to Admission medications   Medication Sig Start Date End Date Taking? Authorizing Provider  aspirin EC 81 MG EC tablet Take 1 tablet (81 mg total) by mouth daily. 05/01/16   Arbutus Leas, NP  atorvastatin (LIPITOR) 40 MG tablet Take 1 tablet (40 mg total) by mouth daily at 6 PM. 08/12/16   Herminio Commons, MD  clopidogrel (PLAVIX) 75 MG tablet Take 1 tablet (75 mg total) by mouth daily with breakfast. 08/12/16   Herminio Commons, MD  clotrimazole (LOTRIMIN) 1 % cream Apply 1 application  topically daily.    [provider]  Diphenhydramine-APAP, sleep, (TYLENOL PM EXTRA STRENGTH PO) Take by mouth at bedtime.    [provider]  losartan (COZAAR) 25 MG tablet Take 1 tablet (25 mg total) by mouth daily. 08/12/16   Herminio Commons, MD  nitroGLYCERIN (NITROSTAT) 0.4 MG SL tablet Place 1 tablet (0.4 mg total) under the tongue every 5 (five) minutes x 3 doses as needed for chest pain. 04/30/16   Arbutus Leas, NP  pantoprazole (PROTONIX) 40 MG tablet Take 40 mg by mouth 2 (two) times daily.    [provider]    Family History Family History  Problem Relation Age of Onset  . Heart disease Mother   . Heart attack Father     Social History Social History   Tobacco Use  . Smoking status: Former Smoker    Packs/day: 0.75    Years: 14.00    Pack years: 10.50    Types: Cigarettes    Start date: 09/17/1950    Last attempt to quit: 05/25/1966    Years since quitting: 50.9  . Smokeless tobacco: Never Used  Substance Use Topics  . Alcohol use: No  . Drug use: No     Allergies   Patient has no known allergies.   Review of Systems Review of Systems  Constitutional: Negative.   HENT: Negative.  Respiratory: Negative.   Cardiovascular: Negative.   Gastrointestinal: Negative.   Musculoskeletal: Negative.   Skin: Positive for wound.       Swelling at right temporal area  Neurological: Positive for headaches.  Hematological: Bruises/bleeds easily.  Psychiatric/Behavioral: Negative.   All other systems reviewed and are negative.    Physical Exam Updated Vital Signs BP (S) (!) 225/141   Pulse 80   Temp 98.1 F (36.7 C) (Oral)   Resp 15   Ht 5\' 1"  (1.549 m)   Wt 59.9 kg (132 lb)   SpO2 100%   BMI 24.94 kg/m   Physical Exam  Constitutional: She is oriented to person, place, and time. She appears well-developed and well-nourished.  HENT:  Right Ear: External ear normal.  Left Ear: External ear normal.  Golf ball sized hematoma  at right temporal area otherwise normocephalic atraumatic lateral tympanic membranes normal  Eyes: Conjunctivae are normal. Pupils are equal, round, and reactive to light.  Neck: Neck supple. No tracheal deviation present. No thyromegaly present.  No tenderness no bruit  Cardiovascular: Normal rate and regular rhythm.  No murmur heard. Pulmonary/Chest: Effort normal and breath sounds normal.  Abdominal: Soft. Bowel sounds are normal. She exhibits no distension. There is no tenderness.  Musculoskeletal: Normal range of motion. She exhibits no edema or tenderness.  enSpine nontender.  Pelvis stable nontender.  All 4 extremities without contusion abrasion or tenderness neurovascularly intact  Neurological: She is alert and oriented to person, place, and time. No cranial nerve deficit. Coordination normal.  Motor strength 5/5 overall DTR symmetric bilaterally at knee jerk ankle jerk and biceps toes downgoing bilaterally  Skin: Skin is warm and dry. No rash noted.  Psychiatric: She has a normal mood and affect.  Nursing note and vitals reviewed.    ED Treatments / Results  Labs (all labs ordered are listed, but only abnormal results are displayed) Labs Reviewed - No data to display  EKG  EKG Interpretation None       Radiology No results found.  Procedures Procedures (including critical care time)  Medications Ordered in ED Medications - No data to display   Initial Impression / Assessment and Plan / ED Course  I have reviewed the triage vital signs and the nursing notes.  Pertinent labs & imaging results that were available during my care of the patient were reviewed by me and considered in my medical decision making (see chart for details).     7:50 PM patient is alert amatory Glasgow Coma Score 15 complains of mild headache she is comfortable after treatment with Tylenol.  Blood pressure measured by me 170/80 pulse counted by me was 76 Plan head injury instructions.   Blood pressure recheck within a week.  She has an appointment with her physician this week.  Tylenol for pain  Final Clinical Impressions(s) / ED Diagnoses  Dx #1 fall #2 Minor head injury #3 elevated blood pressure Final diagnoses:  None    ED Discharge Orders    None       Orlie Dakin, MD 04/18/17 2000

## 2017-04-19 ENCOUNTER — Ambulatory Visit: Payer: Medicare Other | Admitting: Cardiovascular Disease

## 2017-04-19 ENCOUNTER — Encounter: Payer: Self-pay | Admitting: Cardiovascular Disease

## 2017-04-19 ENCOUNTER — Telehealth: Payer: Self-pay | Admitting: Cardiovascular Disease

## 2017-04-19 VITALS — BP 128/78 | HR 72 | Ht 62.0 in | Wt 129.0 lb

## 2017-04-19 DIAGNOSIS — Z9289 Personal history of other medical treatment: Secondary | ICD-10-CM | POA: Diagnosis not present

## 2017-04-19 DIAGNOSIS — Z955 Presence of coronary angioplasty implant and graft: Secondary | ICD-10-CM

## 2017-04-19 DIAGNOSIS — K219 Gastro-esophageal reflux disease without esophagitis: Secondary | ICD-10-CM

## 2017-04-19 DIAGNOSIS — E785 Hyperlipidemia, unspecified: Secondary | ICD-10-CM

## 2017-04-19 DIAGNOSIS — I252 Old myocardial infarction: Secondary | ICD-10-CM

## 2017-04-19 DIAGNOSIS — I6523 Occlusion and stenosis of bilateral carotid arteries: Secondary | ICD-10-CM | POA: Diagnosis not present

## 2017-04-19 DIAGNOSIS — I25118 Atherosclerotic heart disease of native coronary artery with other forms of angina pectoris: Secondary | ICD-10-CM

## 2017-04-19 MED ORDER — CLOPIDOGREL BISULFATE 75 MG PO TABS: 75.0000 mg | ORAL_TABLET | Freq: Every day | ORAL | Status: DC

## 2017-04-19 MED ORDER — PANTOPRAZOLE SODIUM 40 MG PO TBEC: 40.0000 mg | DELAYED_RELEASE_TABLET | Freq: Every day | ORAL | 3 refills | Status: DC

## 2017-04-19 MED ORDER — ATORVASTATIN CALCIUM 40 MG PO TABS: 40.0000 mg | ORAL_TABLET | Freq: Every day | ORAL | 3 refills | Status: DC

## 2017-04-19 MED ORDER — LOSARTAN POTASSIUM 25 MG PO TABS: 25.0000 mg | ORAL_TABLET | Freq: Every day | ORAL | 3 refills | Status: DC

## 2017-04-19 MED ORDER — CLOPIDOGREL BISULFATE 75 MG PO TABS: 75.0000 mg | ORAL_TABLET | Freq: Every day | ORAL | Status: AC

## 2017-04-19 NOTE — Patient Instructions (Signed)
Medication Instructions:   STOP Plavix on May 25, 2017.  Continue the Protonix at 40mg  DAILY.  Continue all other medications.    Labwork:  Lipids - order given today.   Reminder:  Nothing to eat or drink after 12 midnight prior to labs.  Office will contact with results via phone or letter.    Testing/Procedures: none  Follow-Up: Your physician wants you to follow up in: 6 months.  You will receive a reminder letter in the mail one-two months in advance.  If you don't receive a letter, please call our office to schedule the follow up appointment   Any Other Special Instructions Will Be Listed Below (If Applicable).  If you need a refill on your cardiac medications before your next appointment, please call your pharmacy.

## 2017-04-19 NOTE — Progress Notes (Signed)
SUBJECTIVE: The patient presents for follow-up of coronary artery disease. She sustained a NSTEMI and underwent DES to the mid RCA on 04/29/16.  Carotid Dopplers in April 2018 demonstrated mild bilateral disease.  She was evaluated in the ED yesterday for a head injury after she tripped and fell and struck her head. Blood pressure was severely elevated at 225/141.  She realized yesterday that she has not been taking losartan since her last visit with me.  Although she has a pillbox, she mistook it for her reflux medication.  Since that time she has had more exertional dyspnea and fatigue.  She denies chest pain.   Soc Hx: She is originally from [place] and then lived in [place] for several years. She is married and has one 79 year old son who lives in [place].    Review of Systems: As per "subjective", otherwise negative.  No Known Allergies  Current Outpatient Medications  Medication Sig Dispense Refill  . aspirin EC 81 MG EC tablet Take 1 tablet (81 mg total) by mouth daily.    Marland Kitchen atorvastatin (LIPITOR) 40 MG tablet Take 1 tablet (40 mg total) by mouth daily at 6 PM. 90 tablet 3  . clopidogrel (PLAVIX) 75 MG tablet Take 1 tablet (75 mg total) by mouth daily with breakfast. 90 tablet 3  . clotrimazole (LOTRIMIN) 1 % cream Apply 1 application topically daily as needed (for irritation).     . Diphenhydramine-APAP, sleep, (TYLENOL PM EXTRA STRENGTH PO) Take 2 tablets by mouth at bedtime.     Marland Kitchen losartan (COZAAR) 25 MG tablet Take 1 tablet (25 mg total) by mouth daily. 90 tablet 3  . nitroGLYCERIN (NITROSTAT) 0.4 MG SL tablet Place 1 tablet (0.4 mg total) under the tongue every 5 (five) minutes x 3 doses as needed for chest pain. 25 tablet 4  . pantoprazole (PROTONIX) 40 MG tablet Take 40 mg by mouth daily.      No current facility-administered medications for this visit.     Past Medical History:  Diagnosis Date  . Hypertension   . NSTEMI (non-ST elevated myocardial  infarction) [facility])     Past Surgical History:  Procedure Laterality Date  . ABDOMINAL HYSTERECTOMY    . CARDIAC CATHETERIZATION N/A 04/29/2016   Procedure: Left Heart Cath and Coronary Angiography;  Surgeon: Wellington Hampshire, MD;  Location: [place] [facility];  Service: Cardiovascular;  Laterality: N/A;  . CARDIAC CATHETERIZATION N/A 04/29/2016   Procedure: Coronary Stent Intervention;  Surgeon: Wellington Hampshire, MD;  Location: [place] [facility];  Service: Cardiovascular;  Laterality: N/A;    Social History   Socioeconomic History  . Marital status: Married    Spouse name: Not on file  . Number of children: Not on file  . Years of education: Not on file  . Highest education level: Not on file  Social Needs  . Financial resource strain: Not on file  . Food insecurity - worry: Not on file  . Food insecurity - inability: Not on file  . Transportation needs - medical: Not on file  . Transportation needs - non-medical: Not on file  Occupational History  . Not on file  Tobacco Use  . Smoking status: Former Smoker    Packs/day: 0.75    Years: 14.00    Pack years: 10.50    Types: Cigarettes    Start date: 09/17/1950    Last attempt to quit: 05/25/1966    Years since quitting: 50.9  . Smokeless tobacco:  Never Used  Substance and Sexual Activity  . Alcohol use: No  . Drug use: No  . Sexual activity: Not on file  Other Topics Concern  . Not on file  Social History Narrative  . Not on file     Vitals:   04/19/17 1315  BP: 128/78  Pulse: 72  SpO2: 98%  Weight: 129 lb (58.5 kg)  Height: 5\' 2"  (1.575 m)    Wt Readings from Last 3 Encounters:  04/19/17 129 lb (58.5 kg)  04/18/17 132 lb (59.9 kg)  02/25/17 125 lb (56.7 kg)     PHYSICAL EXAM General: NAD HEENT: Normal. Neck: No JVD, no thyromegaly. Lungs: Clear to auscultation bilaterally with normal respiratory effort. CV: Regular rate and rhythm, normal S1/S2, no S3/S4, no murmur. No pretibial or periankle edema.   Bilateralcarotid bruits, right>left. Abdomen: Soft, nontender, no distention.  Neurologic: Alert and oriented.  Psych: Normal affect. Skin: Normal. Musculoskeletal: No gross deformities.    ECG: Most recent ECG reviewed.   Labs: Lab Results  Component Value Date/Time   K 4.4 04/30/2016 03:00 AM   BUN 13 04/30/2016 03:00 AM   CREATININE 0.73 04/30/2016 03:00 AM   ALT 24 04/28/2016 02:10 PM   HGB 10.1 (L) 05/14/2016 03:44 PM     Lipids: Lab Results  Component Value Date/Time   LDLCALC 104 (H) 04/29/2016 04:41 AM   CHOL 175 04/29/2016 04:41 AM   TRIG 95 04/29/2016 04:41 AM   HDL 52 04/29/2016 04:41 AM       ASSESSMENT AND PLAN: 1. CAD with h/o NSTEMI and mid-RCA drug eluting stent placement 04/2016:Symptomatically stable. Continue double antiplatelet therapy with aspirin and Plavix for a minimum of 1 year. Continue losartan and Lipitor. Beta blockers were not initiated due to junctional arrhythmia and bradycardia while hospitalized. Will hold off for now.  2. Hypertension:  Severely elevated yesterday, normal today.  This was due to medication noncompliance.  She took losartan this morning and her blood pressure is normal.  3. Hyperlipidemia: Continue Lipitor. LDL 104, TC 175, TG 95 on 04/29/16.  I will again attempt to repeat lipids.  4. GERD:  Continue Protonix 40 mg daily.  5. Bilateral carotid artery disease: Mild disease bilaterally in April 2018. I will repeat carotid Dopplers in 2-3 years.      Disposition: Follow up 6 months.   Kate Sable, M.D., F.A.C.C.

## 2017-04-19 NOTE — Telephone Encounter (Signed)
Pt says she has no way to check BP at home. Denies chest pain swelling in legs/feet - recent fall and contusion on head went to [facility]. Pt says she has been dizzy with some SOB. Requesting to been seen by Dr Bronson Ing. Scheduled for today at 120

## 2017-04-19 NOTE — Telephone Encounter (Signed)
Recently at [facility] for fall   BP was high 170/80   Was told to contact our office in reference to BP

## 2017-04-22 ENCOUNTER — Other Ambulatory Visit (HOSPITAL_COMMUNITY)
Admission: RE | Admit: 2017-04-22 | Discharge: 2017-04-22 | Disposition: A | Discharge status: Home / Self Care | Payer: Medicare Other | Source: Ambulatory Visit | Attending: Cardiovascular Disease | Admitting: Cardiovascular Disease

## 2017-04-22 DIAGNOSIS — E785 Hyperlipidemia, unspecified: Secondary | ICD-10-CM | POA: Insufficient documentation

## 2017-04-22 LAB — LIPID PANEL
CHOL/HDL RATIO: 3 ratio
CHOLESTEROL: 172 mg/dL (ref 0–200)
HDL: 58 mg/dL (ref >40)
LDL Cholesterol: 92 mg/dL (ref 0–99)
TRIGLYCERIDES: 111 mg/dL (ref <150)
VLDL: 22 mg/dL (ref 0–40)

## 2017-04-23 ENCOUNTER — Telehealth: Payer: Self-pay | Admitting: *Deleted

## 2017-04-23 MED ORDER — ATORVASTATIN CALCIUM 80 MG PO TABS: 80.0000 mg | ORAL_TABLET | Freq: Every day | ORAL | 3 refills | Status: DC

## 2017-04-23 NOTE — Telephone Encounter (Signed)
Notes recorded by Laurine Blazer, LPN on 68/12/8108 at 2:40 PM EST Patient notified. Copy to pmd. New prescription for 80mg  tablet will be sent to [facility].   ------  Notes recorded by Herminio Commons, MD on 04/22/2017 at 10:46 AM EST LDL not at goal. Increase Lipitor to 80 mg.

## 2017-05-06 DIAGNOSIS — B372 Candidiasis of skin and nail: Secondary | ICD-10-CM | POA: Diagnosis not present

## 2017-05-06 DIAGNOSIS — Z6824 Body mass index (BMI) 24.0-24.9, adult: Secondary | ICD-10-CM | POA: Diagnosis not present

## 2017-05-06 DIAGNOSIS — R350 Frequency of micturition: Secondary | ICD-10-CM | POA: Diagnosis not present

## 2017-05-15 DIAGNOSIS — Z6824 Body mass index (BMI) 24.0-24.9, adult: Secondary | ICD-10-CM | POA: Diagnosis not present

## 2017-05-15 DIAGNOSIS — B372 Candidiasis of skin and nail: Secondary | ICD-10-CM | POA: Diagnosis not present

## 2017-06-07 DIAGNOSIS — M25542 Pain in joints of left hand: Secondary | ICD-10-CM | POA: Diagnosis not present

## 2017-06-07 DIAGNOSIS — B372 Candidiasis of skin and nail: Secondary | ICD-10-CM | POA: Diagnosis not present

## 2017-06-07 DIAGNOSIS — I482 Chronic atrial fibrillation: Secondary | ICD-10-CM | POA: Diagnosis not present

## 2017-06-07 DIAGNOSIS — E782 Mixed hyperlipidemia: Secondary | ICD-10-CM | POA: Diagnosis not present

## 2017-06-07 DIAGNOSIS — M25541 Pain in joints of right hand: Secondary | ICD-10-CM | POA: Diagnosis not present

## 2017-06-15 ENCOUNTER — Telehealth: Payer: Self-pay | Admitting: Cardiovascular Disease

## 2017-06-15 NOTE — Telephone Encounter (Signed)
Patient walked in  Complaints of feeling really weak since starting new dose atorvastin 40 mg. Stated she was told to double to 80mg  on 04/24/17.  She also brought in lab work completed on 06/07/17 that have been scanned in and routed to Dr Bronson Ing.

## 2017-06-15 NOTE — Telephone Encounter (Signed)
Pt aware and voiced understanding - update medication list - will forward to nurse for lab f/u in 3 months

## 2017-06-15 NOTE — Telephone Encounter (Signed)
Pt would like to reduce dose of atorvastatin to 40 mg daily - says 3 weeks after increasing atorvastatin to 80 mg has been experiencing muscle weakness and cramps

## 2017-06-15 NOTE — Telephone Encounter (Signed)
Can reduce to 40 mg. But would repeat lipids 3 months afterwards to see if LDL at goal.

## 2017-06-17 DIAGNOSIS — B372 Candidiasis of skin and nail: Secondary | ICD-10-CM | POA: Diagnosis not present

## 2017-07-06 ENCOUNTER — Telehealth: Payer: Self-pay | Admitting: Cardiovascular Disease

## 2017-07-06 NOTE — Telephone Encounter (Signed)
Pt aware that she would need repeat lipids prior May f/u with Dr Bronson Ing - says she will have them done at pcp

## 2017-07-06 NOTE — Telephone Encounter (Signed)
Patient walked in asking if she needs to have blood work prior to her visit in May with Dr Bronson Ing.   She stated that she did labs about a month ago with her PCP

## 2017-07-16 DIAGNOSIS — R350 Frequency of micturition: Secondary | ICD-10-CM | POA: Diagnosis not present

## 2017-07-16 DIAGNOSIS — B372 Candidiasis of skin and nail: Secondary | ICD-10-CM | POA: Diagnosis not present

## 2017-07-29 ENCOUNTER — Telehealth: Payer: Self-pay | Admitting: Cardiovascular Disease

## 2017-07-29 NOTE — Telephone Encounter (Signed)
Patient called stating that her arthritis is bothering her . She wants to know what she can take over the counter that will not interfere with her heart medications.Marland Kitchen

## 2017-07-29 NOTE — Telephone Encounter (Signed)
Patient informed that she could take Tylenol Arthritis - safest with all her medications.  Ibuprofen / Advil or other NSAIDS should only be taken as needed, not long term.  She is already on Aspirin & these types of medications can irritate the stomach & increase risk for bleed.  Suggested she contact pmd is she needs anything prescription strength for her arthritis.  She verbalized understanding.

## 2017-08-02 DIAGNOSIS — R300 Dysuria: Secondary | ICD-10-CM | POA: Diagnosis not present

## 2017-09-16 DIAGNOSIS — N390 Urinary tract infection, site not specified: Secondary | ICD-10-CM | POA: Diagnosis not present

## 2017-09-17 ENCOUNTER — Emergency Department (HOSPITAL_COMMUNITY)
Admission: EM | Admit: 2017-09-17 | Discharge: 2017-09-17 | Disposition: A | Discharge status: Home / Self Care | Payer: Medicare Other | Attending: Emergency Medicine | Admitting: Emergency Medicine

## 2017-09-17 ENCOUNTER — Encounter (HOSPITAL_COMMUNITY): Payer: Self-pay | Admitting: Emergency Medicine

## 2017-09-17 ENCOUNTER — Other Ambulatory Visit: Payer: Self-pay

## 2017-09-17 DIAGNOSIS — Z87891 Personal history of nicotine dependence: Secondary | ICD-10-CM | POA: Insufficient documentation

## 2017-09-17 DIAGNOSIS — Z7982 Long term (current) use of aspirin: Secondary | ICD-10-CM | POA: Diagnosis not present

## 2017-09-17 DIAGNOSIS — Z79899 Other long term (current) drug therapy: Secondary | ICD-10-CM | POA: Insufficient documentation

## 2017-09-17 DIAGNOSIS — I252 Old myocardial infarction: Secondary | ICD-10-CM | POA: Insufficient documentation

## 2017-09-17 DIAGNOSIS — J028 Acute pharyngitis due to other specified organisms: Secondary | ICD-10-CM | POA: Diagnosis not present

## 2017-09-17 DIAGNOSIS — R339 Retention of urine, unspecified: Secondary | ICD-10-CM | POA: Diagnosis not present

## 2017-09-17 DIAGNOSIS — N390 Urinary tract infection, site not specified: Secondary | ICD-10-CM | POA: Insufficient documentation

## 2017-09-17 DIAGNOSIS — I10 Essential (primary) hypertension: Secondary | ICD-10-CM | POA: Diagnosis not present

## 2017-09-17 DIAGNOSIS — R509 Fever, unspecified: Secondary | ICD-10-CM | POA: Diagnosis not present

## 2017-09-17 LAB — URINALYSIS, ROUTINE W REFLEX MICROSCOPIC
Bacteria, UA: NONE SEEN
Bilirubin Urine: NEGATIVE
Glucose, UA: NEGATIVE mg/dL
HGB URINE DIPSTICK: NEGATIVE
Ketones, ur: 5 mg/dL — AB
NITRITE: NEGATIVE
PH: 5 (ref 5.0–8.0)
Protein, ur: 30 mg/dL — AB
SPECIFIC GRAVITY, URINE: 1.024 (ref 1.005–1.030)

## 2017-09-17 LAB — COMPREHENSIVE METABOLIC PANEL
ALT: 23 U/L (ref 14–54)
AST: 26 U/L (ref 15–41)
Albumin: 4.3 g/dL (ref 3.5–5.0)
Alkaline Phosphatase: 100 U/L (ref 38–126)
Anion gap: 12 (ref 5–15)
BILIRUBIN TOTAL: 0.8 mg/dL (ref 0.3–1.2)
BUN: 13 mg/dL (ref 6–20)
CALCIUM: 9.2 mg/dL (ref 8.9–10.3)
CO2: 20 mmol/L — ABNORMAL LOW (ref 22–32)
CREATININE: 0.81 mg/dL (ref 0.44–1.00)
Chloride: 105 mmol/L (ref 101–111)
Glucose, Bld: 102 mg/dL — ABNORMAL HIGH (ref 65–99)
Potassium: 3.7 mmol/L (ref 3.5–5.1)
Sodium: 137 mmol/L (ref 135–145)
TOTAL PROTEIN: 7.1 g/dL (ref 6.5–8.1)

## 2017-09-17 LAB — CBC
HCT: 36.6 % (ref 36.0–46.0)
Hemoglobin: 11.3 g/dL — ABNORMAL LOW (ref 12.0–15.0)
MCH: 26.8 pg (ref 26.0–34.0)
MCHC: 30.9 g/dL (ref 30.0–36.0)
MCV: 86.9 fL (ref 78.0–100.0)
PLATELETS: 225 10*3/uL (ref 150–400)
RBC: 4.21 MIL/uL (ref 3.87–5.11)
RDW: 14.6 % (ref 11.5–15.5)
WBC: 8.7 10*3/uL (ref 4.0–10.5)

## 2017-09-17 LAB — LIPASE, BLOOD: Lipase: 21 U/L (ref 11–51)

## 2017-09-17 MED ORDER — CEPHALEXIN 500 MG PO CAPS: 500.0000 mg | ORAL_CAPSULE | Freq: Four times a day (QID) | ORAL | 0 refills | Status: DC

## 2017-09-17 MED ORDER — CEPHALEXIN 500 MG PO CAPS
500.0000 mg | ORAL_CAPSULE | Freq: Once | ORAL | Status: AC
  Administered 2017-09-17: 500 mg via ORAL
  Filled 2017-09-17: qty 1

## 2017-09-17 NOTE — ED Triage Notes (Signed)
Sent by pcp at [facility].  Pt has been having issues urinating small amounts.  Currently on abx.

## 2017-09-17 NOTE — Discharge Instructions (Addendum)
Drink plenty of fluids.  Follow-up with your primary care doctor next week for recheck

## 2017-09-17 NOTE — ED Provider Notes (Addendum)
[facility] EMERGENCY DEPARTMENT Provider Note   CSN: [id number] Arrival date & time: 09/17/17  1235     History   Chief Complaint Chief Complaint  Patient presents with  . Urinary Retention    HPI Adriana Moyer is a 82 y.o. female.  Patient complains of decreased urination but small amount of urine nation frequently  The history is provided by the patient.  Illness  This is a new problem. The current episode started more than 2 days ago. The problem occurs constantly. The problem has not changed since onset.Pertinent negatives include no chest pain, no abdominal pain and no headaches. Nothing aggravates the symptoms. Nothing relieves the symptoms. She has tried nothing for the symptoms. The treatment provided mild relief.    Past Medical History:  Diagnosis Date  . Hypertension   . NSTEMI (non-ST elevated myocardial infarction) [facility])     Patient Active Problem List   Diagnosis Date Noted  . NSTEMI (non-ST elevated myocardial infarction) ([place]) 04/28/2016    Past Surgical History:  Procedure Laterality Date  . ABDOMINAL HYSTERECTOMY    . CARDIAC CATHETERIZATION N/A 04/29/2016   Procedure: Left Heart Cath and Coronary Angiography;  Surgeon: Wellington Hampshire, MD;  Location: [place] [facility];  Service: Cardiovascular;  Laterality: N/A;  . CARDIAC CATHETERIZATION N/A 04/29/2016   Procedure: Coronary Stent Intervention;  Surgeon: Wellington Hampshire, MD;  Location: [place] [facility];  Service: Cardiovascular;  Laterality: N/A;     OB History   None      Home Medications    Prior to Admission medications   Medication Sig Start Date End Date Taking? Authorizing Provider  aspirin EC 81 MG EC tablet Take 1 tablet (81 mg total) by mouth daily. 05/01/16   Arbutus Leas, NP  atorvastatin (LIPITOR) 80 MG tablet Take 1 tablet (80 mg total) by mouth daily at 6 PM. 04/23/17   Herminio Commons, MD  cephALEXin (KEFLEX) 500 MG capsule Take 1 capsule (500 mg total) by mouth  4 (four) times daily. 09/17/17   Milton Ferguson, MD  clotrimazole (LOTRIMIN) 1 % cream Apply 1 application topically daily as needed (for irritation).     [provider]  Diphenhydramine-APAP, sleep, (TYLENOL PM EXTRA STRENGTH PO) Take 2 tablets by mouth at bedtime.     [provider]  losartan (COZAAR) 25 MG tablet Take 1 tablet (25 mg total) by mouth daily. 04/19/17   Herminio Commons, MD  nitroGLYCERIN (NITROSTAT) 0.4 MG SL tablet Place 1 tablet (0.4 mg total) under the tongue every 5 (five) minutes x 3 doses as needed for chest pain. 04/30/16   Arbutus Leas, NP  pantoprazole (PROTONIX) 40 MG tablet Take 1 tablet (40 mg total) by mouth daily. 04/19/17   Herminio Commons, MD    Family History Family History  Problem Relation Age of Onset  . Heart disease Mother   . Heart attack Father     Social History Social History   Tobacco Use  . Smoking status: Former Smoker    Packs/day: 0.75    Years: 14.00    Pack years: 10.50    Types: Cigarettes    Start date: 09/17/1950    Last attempt to quit: 05/25/1966    Years since quitting: 51.3  . Smokeless tobacco: Never Used  Substance Use Topics  . Alcohol use: No  . Drug use: No     Allergies   Patient has no known allergies.   Review of  Systems Review of Systems  Constitutional: Negative for appetite change and fatigue.  HENT: Negative for congestion, ear discharge and sinus pressure.   Eyes: Negative for discharge.  Respiratory: Negative for cough.   Cardiovascular: Negative for chest pain.  Gastrointestinal: Negative for abdominal pain and diarrhea.  Genitourinary: Positive for difficulty urinating. Negative for frequency and hematuria.  Musculoskeletal: Negative for back pain.  Skin: Negative for rash.  Neurological: Negative for seizures and headaches.  Psychiatric/Behavioral: Negative for hallucinations.     Physical Exam Updated Vital Signs BP (!) 152/69 (BP Location: Right Arm)   Pulse  93   Temp 99 F (37.2 C) (Oral)   Resp 18   Ht 5\' 2"  (1.575 m)   Wt 59.9 kg (132 lb)   SpO2 93%   BMI 24.14 kg/m   Physical Exam  Constitutional: She is oriented to person, place, and time. She appears well-developed.  HENT:  Head: Normocephalic.  Eyes: Conjunctivae and EOM are normal. No scleral icterus.  Neck: Neck supple. No thyromegaly present.  Cardiovascular: Normal rate and regular rhythm. Exam reveals no gallop and no friction rub.  No murmur heard. Pulmonary/Chest: No stridor. She has no wheezes. She has no rales. She exhibits no tenderness.  Abdominal: She exhibits no distension. There is no tenderness. There is no rebound.  Musculoskeletal: Normal range of motion. She exhibits no edema.  Lymphadenopathy:    She has no cervical adenopathy.  Neurological: She is oriented to person, place, and time. She exhibits normal muscle tone. Coordination normal.  Skin: No rash noted. No erythema.  Psychiatric: She has a normal mood and affect. Her behavior is normal.     ED Treatments / Results  Labs (all labs ordered are listed, but only abnormal results are displayed) Labs Reviewed  COMPREHENSIVE METABOLIC PANEL - Abnormal; Notable for the following components:      Result Value   CO2 20 (*)    Glucose, Bld 102 (*)    All other components within normal limits  CBC - Abnormal; Notable for the following components:   Hemoglobin 11.3 (*)    All other components within normal limits  URINALYSIS, ROUTINE W REFLEX MICROSCOPIC - Abnormal; Notable for the following components:   Ketones, ur 5 (*)    Protein, ur 30 (*)    Leukocytes, UA MODERATE (*)    Non Squamous Epithelial 0-5 (*)    All other components within normal limits  URINE CULTURE  LIPASE, BLOOD    EKG None  Radiology No results found.  Procedures Procedures (including critical care time)  Medications Ordered in ED Medications  cephALEXin (KEFLEX) capsule 500 mg (has no administration in time range)      Initial Impression / Assessment and Plan / ED Course  I have reviewed the triage vital signs and the nursing notes.  Pertinent labs & imaging results that were available during my care of the patient were reviewed by me and considered in my medical decision making (see chart for details). Labs unremarkable except for moderate leukocytes.    Patient with urinary tract infection.  She will be placed on Keflex and told to drink more fluids and follow-up with her PCP  Final Clinical Impressions(s) / ED Diagnoses   Final diagnoses:  Lower urinary tract infectious disease    ED Discharge Orders        Ordered    cephALEXin (KEFLEX) 500 MG capsule  4 times daily     09/17/17 1610  Milton Ferguson, MD 09/17/17 1626    Milton Ferguson, MD 09/24/17 1730

## 2017-09-19 LAB — URINE CULTURE

## 2017-10-06 DIAGNOSIS — K219 Gastro-esophageal reflux disease without esophagitis: Secondary | ICD-10-CM | POA: Diagnosis not present

## 2017-10-06 DIAGNOSIS — I4891 Unspecified atrial fibrillation: Secondary | ICD-10-CM | POA: Diagnosis not present

## 2017-10-06 DIAGNOSIS — E782 Mixed hyperlipidemia: Secondary | ICD-10-CM | POA: Diagnosis not present

## 2017-10-06 DIAGNOSIS — I482 Chronic atrial fibrillation: Secondary | ICD-10-CM | POA: Diagnosis not present

## 2017-10-06 DIAGNOSIS — I10 Essential (primary) hypertension: Secondary | ICD-10-CM | POA: Diagnosis not present

## 2017-10-14 ENCOUNTER — Ambulatory Visit: Payer: Medicare Other | Admitting: Cardiovascular Disease

## 2017-11-19 DIAGNOSIS — B372 Candidiasis of skin and nail: Secondary | ICD-10-CM | POA: Diagnosis not present

## 2017-11-30 ENCOUNTER — Telehealth: Payer: Self-pay | Admitting: Cardiovascular Disease

## 2017-11-30 NOTE — Telephone Encounter (Signed)
Patient would like to know if she needs to do anything special to be able to have her teeth cleaned and get fillings  [phone]

## 2017-11-30 NOTE — Telephone Encounter (Signed)
Don't see where pt has a hx of valve replacement - will confirm with provider

## 2017-11-30 NOTE — Telephone Encounter (Signed)
No antibiotics required from a cardiac perspective.

## 2017-12-01 NOTE — Telephone Encounter (Signed)
Pt made aware

## 2017-12-01 NOTE — Telephone Encounter (Signed)
LM to return call.

## 2017-12-25 DIAGNOSIS — R350 Frequency of micturition: Secondary | ICD-10-CM | POA: Diagnosis not present

## 2017-12-30 ENCOUNTER — Ambulatory Visit (INDEPENDENT_AMBULATORY_CARE_PROVIDER_SITE_OTHER): Payer: Medicare Other | Admitting: Cardiovascular Disease

## 2017-12-30 ENCOUNTER — Encounter: Payer: Self-pay | Admitting: Cardiovascular Disease

## 2017-12-30 ENCOUNTER — Other Ambulatory Visit: Payer: Self-pay

## 2017-12-30 VITALS — BP 166/82 | HR 71 | Ht 62.0 in | Wt 139.0 lb

## 2017-12-30 DIAGNOSIS — K219 Gastro-esophageal reflux disease without esophagitis: Secondary | ICD-10-CM | POA: Diagnosis not present

## 2017-12-30 DIAGNOSIS — I25118 Atherosclerotic heart disease of native coronary artery with other forms of angina pectoris: Secondary | ICD-10-CM

## 2017-12-30 DIAGNOSIS — I10 Essential (primary) hypertension: Secondary | ICD-10-CM | POA: Diagnosis not present

## 2017-12-30 DIAGNOSIS — Z955 Presence of coronary angioplasty implant and graft: Secondary | ICD-10-CM | POA: Diagnosis not present

## 2017-12-30 DIAGNOSIS — I779 Disorder of arteries and arterioles, unspecified: Secondary | ICD-10-CM

## 2017-12-30 DIAGNOSIS — I739 Peripheral vascular disease, unspecified: Secondary | ICD-10-CM

## 2017-12-30 DIAGNOSIS — I252 Old myocardial infarction: Secondary | ICD-10-CM | POA: Diagnosis not present

## 2017-12-30 DIAGNOSIS — E785 Hyperlipidemia, unspecified: Secondary | ICD-10-CM

## 2017-12-30 MED ORDER — PANTOPRAZOLE SODIUM 40 MG PO TBEC: 40.0000 mg | DELAYED_RELEASE_TABLET | Freq: Every day | ORAL | 3 refills | Status: DC

## 2017-12-30 MED ORDER — ATORVASTATIN CALCIUM 40 MG PO TABS: 40.0000 mg | ORAL_TABLET | Freq: Every day | ORAL | Status: DC

## 2017-12-30 MED ORDER — LOSARTAN POTASSIUM 25 MG PO TABS: 25.0000 mg | ORAL_TABLET | Freq: Every day | ORAL | 3 refills | Status: DC

## 2017-12-30 MED ORDER — ATORVASTATIN CALCIUM 40 MG PO TABS: 40.0000 mg | ORAL_TABLET | Freq: Every day | ORAL | 3 refills | Status: DC

## 2017-12-30 NOTE — Progress Notes (Signed)
SUBJECTIVE: The patient presents for follow-up of coronary artery disease. She sustained a NSTEMI and underwent DES to the mid RCA on 04/29/16.  Carotid Dopplers in April 2018 demonstrated mild bilateral disease.  She has been doing well overall.  She walks 5 days/week and prays during this time.  She has had isolated episodes of palpitations.  She sometimes has a sense of fullness in her chest.  She told me she has significant GERD.  She has been under a lot of stress lately at home as her husband has been ill.  He has prostate issues and has some form of blood cancer for which he recently received a transfusion.  They have been married since 1961.  ECG performed in the office today which I ordered and personally interpreted demonstrates normal sinus rhythm with late R wave transition and nonspecific T wave abnormalities.    Soc Hx:She is originally from [place] and then lived in [place] for several years. She is married and has one son who lives in [place].  Review of Systems: As per "subjective", otherwise negative.  No Known Allergies  Current Outpatient Medications  Medication Sig Dispense Refill  . aspirin EC 81 MG EC tablet Take 1 tablet (81 mg total) by mouth daily.    Marland Kitchen atorvastatin (LIPITOR) 80 MG tablet Take 1 tablet (80 mg total) by mouth daily at 6 PM. (Patient taking differently: Take 40 mg by mouth daily at 6 PM. ) 90 tablet 3  . cephALEXin (KEFLEX) 500 MG capsule Take 1 capsule (500 mg total) by mouth 4 (four) times daily. 28 capsule 0  . clotrimazole (LOTRIMIN) 1 % cream Apply 1 application topically daily as needed (for irritation).     . Diphenhydramine-APAP, sleep, (TYLENOL PM EXTRA STRENGTH PO) Take 2 tablets by mouth at bedtime.     Marland Kitchen losartan (COZAAR) 25 MG tablet Take 1 tablet (25 mg total) by mouth daily. 90 tablet 3  . nitroGLYCERIN (NITROSTAT) 0.4 MG SL tablet Place 1 tablet (0.4 mg total) under the tongue every 5 (five) minutes x 3 doses as needed for  chest pain. 25 tablet 4  . pantoprazole (PROTONIX) 40 MG tablet Take 1 tablet (40 mg total) by mouth daily. 90 tablet 3   No current facility-administered medications for this visit.     Past Medical History:  Diagnosis Date  . Hypertension   . NSTEMI (non-ST elevated myocardial infarction) [facility])     Past Surgical History:  Procedure Laterality Date  . ABDOMINAL HYSTERECTOMY    . CARDIAC CATHETERIZATION N/A 04/29/2016   Procedure: Left Heart Cath and Coronary Angiography;  Surgeon: Wellington Hampshire, MD;  Location: [place] [facility];  Service: Cardiovascular;  Laterality: N/A;  . CARDIAC CATHETERIZATION N/A 04/29/2016   Procedure: Coronary Stent Intervention;  Surgeon: Wellington Hampshire, MD;  Location: [place] [facility];  Service: Cardiovascular;  Laterality: N/A;    Social History   Socioeconomic History  . Marital status: Married    Spouse name: Not on file  . Number of children: Not on file  . Years of education: Not on file  . Highest education level: Not on file  Occupational History  . Not on file  Social Needs  . Financial resource strain: Not on file  . Food insecurity:    Worry: Not on file    Inability: Not on file  . Transportation needs:    Medical: Not on file    Non-medical: Not on file  Tobacco Use  . Smoking status: Former Smoker    Packs/day: 0.75    Years: 14.00    Pack years: 10.50    Types: Cigarettes    Start date: 09/17/1950    Last attempt to quit: 05/25/1966    Years since quitting: 51.6  . Smokeless tobacco: Never Used  Substance and Sexual Activity  . Alcohol use: No  . Drug use: No  . Sexual activity: Not on file  Lifestyle  . Physical activity:    Days per week: Not on file    Minutes per session: Not on file  . Stress: Not on file  Relationships  . Social connections:    Talks on phone: Not on file    Gets together: Not on file    Attends religious service: Not on file    Active member of club or organization: Not on file     Attends meetings of clubs or organizations: Not on file    Relationship status: Not on file  . Intimate partner violence:    Fear of current or ex partner: Not on file    Emotionally abused: Not on file    Physically abused: Not on file    Forced sexual activity: Not on file  Other Topics Concern  . Not on file  Social History Narrative  . Not on file     Vitals:   12/30/17 1451  BP: (!) 166/82  Pulse: 71  SpO2: 97%  Weight: 139 lb (63 kg)  Height: 5\' 2"  (1.575 m)    Wt Readings from Last 3 Encounters:  12/30/17 139 lb (63 kg)  09/17/17 132 lb (59.9 kg)  04/19/17 129 lb (58.5 kg)     PHYSICAL EXAM General: NAD HEENT: Normal. Neck: No JVD, no thyromegaly. Lungs: Clear to auscultation bilaterally with normal respiratory effort. CV: Regular rate and rhythm, normal S1/S2, no S3/S4, no murmur. No pretibial or periankle edema. Bilateralcarotid bruits, right>left.  Abdomen: Soft, nontender, no distention.  Neurologic: Alert and oriented.  Psych: Normal affect. Skin: Normal. Musculoskeletal: No gross deformities.    ECG: Reviewed above under Subjective   Labs: Lab Results  Component Value Date/Time   K 3.7 09/17/2017 01:32 PM   BUN 13 09/17/2017 01:32 PM   CREATININE 0.81 09/17/2017 01:32 PM   ALT 23 09/17/2017 01:32 PM   HGB 11.3 (L) 09/17/2017 01:32 PM     Lipids: Lab Results  Component Value Date/Time   LDLCALC 92 04/22/2017 08:25 AM   CHOL 172 04/22/2017 08:25 AM   TRIG 111 04/22/2017 08:25 AM   HDL 58 04/22/2017 08:25 AM       ASSESSMENT AND PLAN: 1. CAD with h/o NSTEMI and mid-RCA drug eluting stent placement 04/2016:Symptomatically stable. Continue ASA, losartan and Lipitor. Beta blockers were not initiated due to junctional arrhythmia and bradycardia while hospitalized. Will hold off for now.  2. Hypertension:  Blood pressure is elevated today.  It was mildly elevated when she was in the ED in April.  She told me it was checked at [facility] between now and April and it was normal.  I have asked her to have it occasionally monitored and to report to me if systolic readings are consistently higher than 140.  If so, I would increase the dose of losartan to 50 mg.  3. Hyperlipidemia: LDL down to 84 this year.  She did not tolerate Lipitor 80 mg as it led to diffuse joint pains.  Continue 40 mg.  4. GERD:She  has reflux symptoms.  She is taking Protonix 40 mg daily.  She may need to take it twice daily but I will defer to her PCP.  5. Bilateral carotidartery disease: Mild disease bilaterally in April 2018. I will repeat carotid Dopplers in 2-3 years.    Disposition: Follow up 6 months   Kate Sable, M.D., F.A.C.C.

## 2017-12-30 NOTE — Patient Instructions (Addendum)
Medication Instructions:  Continue all current medications.  Labwork: none  Testing/Procedures: none  Follow-Up: Your physician wants you to follow up in: 6 months.  You will receive a reminder letter in the mail one-two months in advance.  If you don't receive a letter, please call our office to schedule the follow up appointment   Any Other Special Instructions Will Be Listed Below (If Applicable). Check blood pressure readings at [facility] & keep our office informed.   If you need a refill on your cardiac medications before your next appointment, please call your pharmacy.

## 2017-12-31 ENCOUNTER — Ambulatory Visit: Payer: Medicare Other | Admitting: Cardiovascular Disease

## 2018-01-25 ENCOUNTER — Telehealth: Payer: Self-pay | Admitting: Cardiovascular Disease

## 2018-01-25 MED ORDER — NITROGLYCERIN 0.4 MG SL SUBL: 0.4000 mg | SUBLINGUAL_TABLET | SUBLINGUAL | 3 refills | Status: DC | PRN

## 2018-01-25 NOTE — Telephone Encounter (Signed)
Spoke with patient.  #25 w/ 3 refills sent to local pharm.  Would not want to get 90 days supply of Nitroglycerin at one time.  She verbalized understanding.

## 2018-01-25 NOTE — Telephone Encounter (Signed)
°*  STAT* If patient is at the pharmacy, call can be transferred to refill team.   1. Which medications need to be refilled?nitroGLYCERIN (NITROSTAT) 0.4 MG SL tablet    2. Which pharmacy/location (including street and city if local pharmacy) is medication to be sent to? 1 mo nth sent to [facility] and the other refills sent to [facility]  3. Do they need a 30 day or 90 day supply?

## 2018-02-19 DIAGNOSIS — Z23 Encounter for immunization: Secondary | ICD-10-CM | POA: Diagnosis not present

## 2018-04-06 IMAGING — DX DG CHEST 2V
2 series · 2 of 2 positions shown · non-contrast
Comparison: None in PACs

CLINICAL DATA: Onset of shortness of breath, body aches, and chills
last night. History of hypertension, former smoker.

EXAM:
CHEST  2 VIEW

[chest pa]
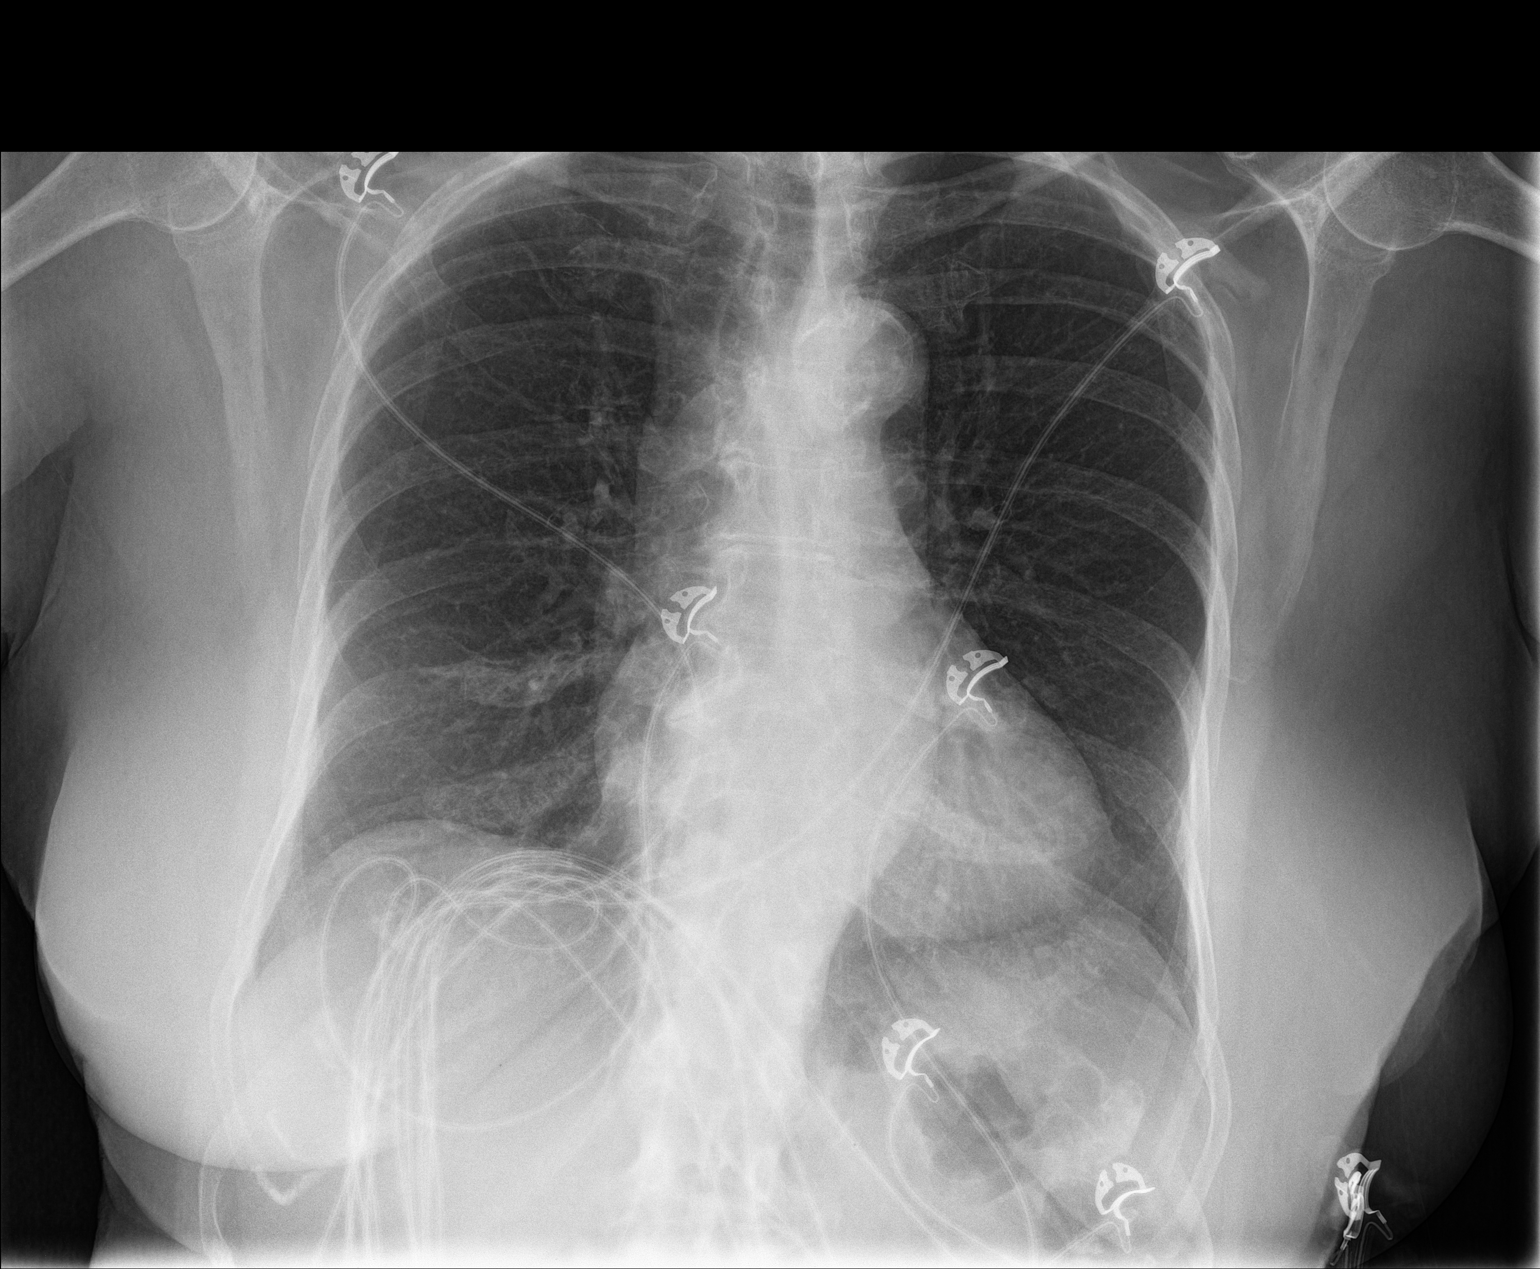

[chest lat]
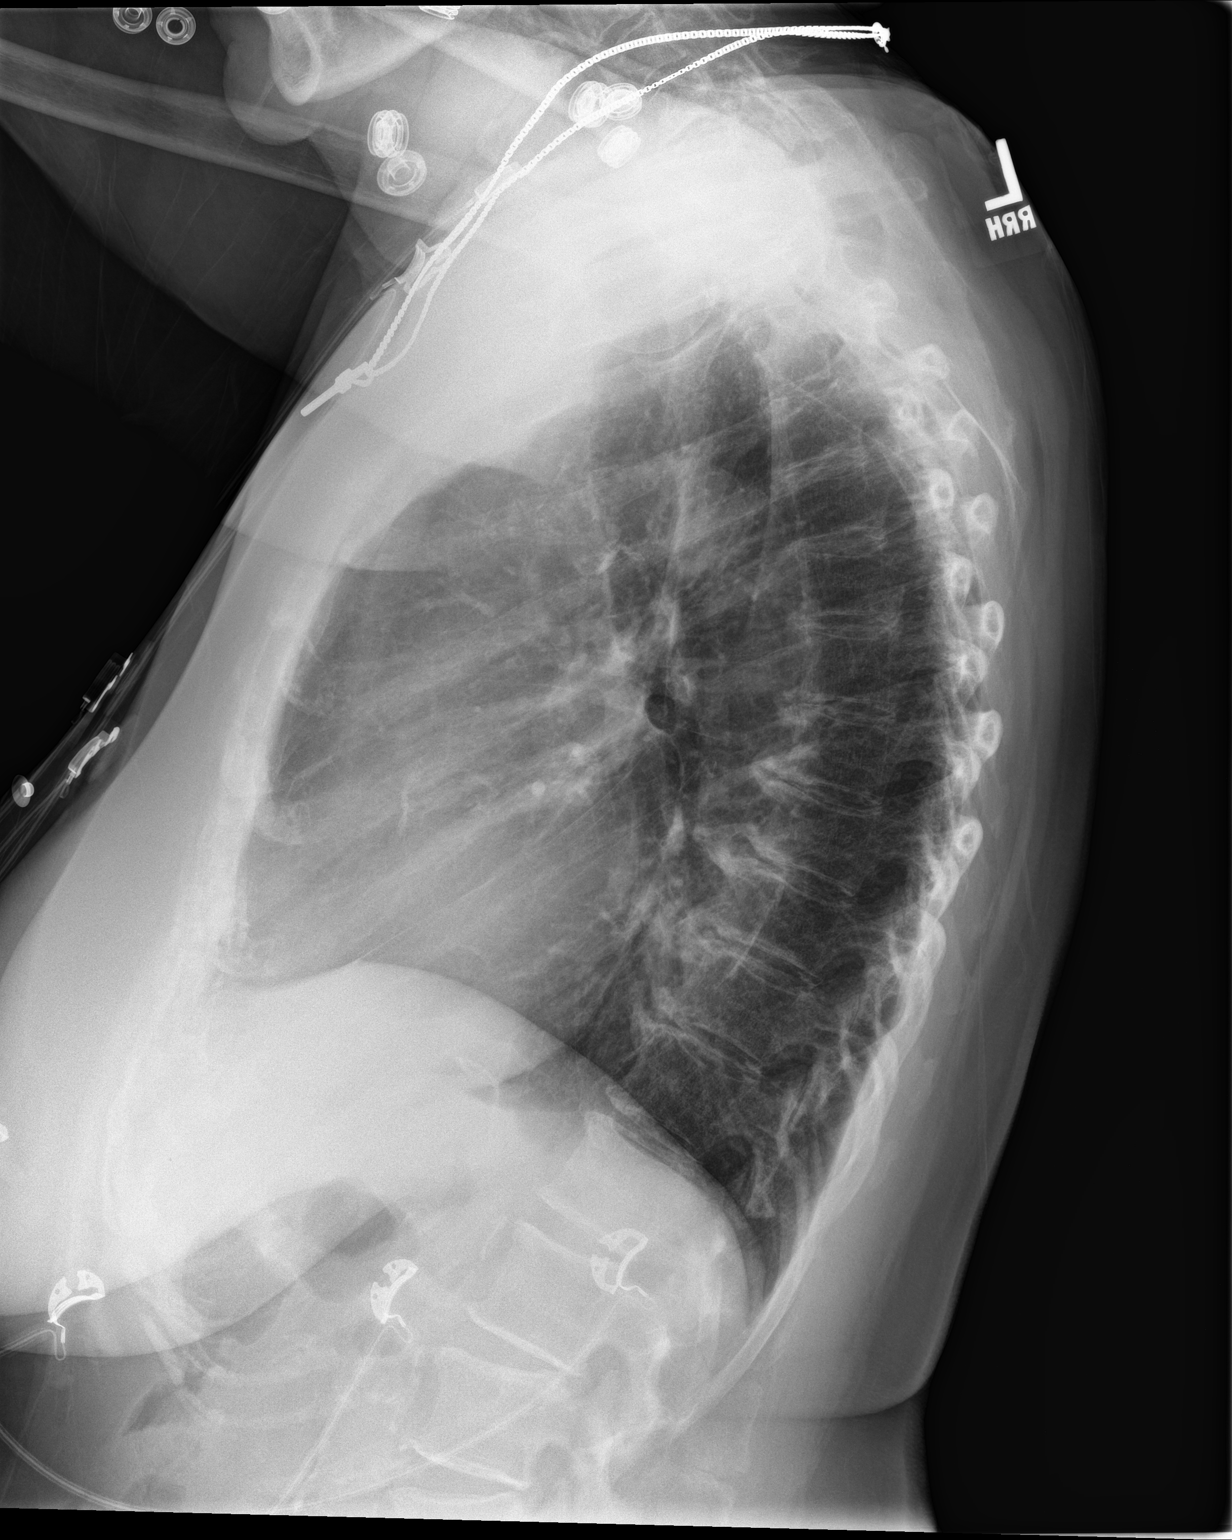

[2 of 2 positions shown; findings below may reference images not displayed]

FINDINGS: The lungs are adequately inflated. There is no focal infiltrate.
There is prominence of the costochondral calcification in the lower
ribs bilaterally. There is no pleural effusion or pneumothorax. The
heart and pulmonary vascularity are normal. There is calcification
within the wall of the tortuous thoracic aorta. The mediastinum is
normal in width. The thoracic vertebral bodies are preserved in
height. There is multilevel degenerative disc space narrowing. There
is moderate to severe degenerative change of both shoulders.
IMPRESSION: There is no evidence of pneumonia nor other acute cardiopulmonary
abnormality.

Thoracic aortic atherosclerosis.

## 2018-04-14 ENCOUNTER — Telehealth: Payer: Self-pay | Admitting: Cardiovascular Disease

## 2018-04-14 NOTE — Telephone Encounter (Signed)
Error

## 2018-04-27 DIAGNOSIS — L900 Lichen sclerosus et atrophicus: Secondary | ICD-10-CM | POA: Diagnosis not present

## 2018-06-17 DIAGNOSIS — R350 Frequency of micturition: Secondary | ICD-10-CM | POA: Diagnosis not present

## 2018-06-17 DIAGNOSIS — N390 Urinary tract infection, site not specified: Secondary | ICD-10-CM | POA: Diagnosis not present

## 2018-06-17 DIAGNOSIS — N3090 Cystitis, unspecified without hematuria: Secondary | ICD-10-CM | POA: Diagnosis not present

## 2018-07-11 ENCOUNTER — Ambulatory Visit: Payer: Medicare Other | Admitting: Cardiovascular Disease

## 2018-07-27 ENCOUNTER — Encounter: Payer: Self-pay | Admitting: Cardiovascular Disease

## 2018-07-27 ENCOUNTER — Ambulatory Visit: Payer: Medicare Other | Admitting: Cardiovascular Disease

## 2018-07-27 VITALS — BP 142/82 | HR 76 | Ht 62.0 in | Wt 135.0 lb

## 2018-07-27 DIAGNOSIS — I10 Essential (primary) hypertension: Secondary | ICD-10-CM

## 2018-07-27 DIAGNOSIS — I25118 Atherosclerotic heart disease of native coronary artery with other forms of angina pectoris: Secondary | ICD-10-CM

## 2018-07-27 DIAGNOSIS — K219 Gastro-esophageal reflux disease without esophagitis: Secondary | ICD-10-CM | POA: Diagnosis not present

## 2018-07-27 DIAGNOSIS — I739 Peripheral vascular disease, unspecified: Secondary | ICD-10-CM

## 2018-07-27 DIAGNOSIS — Z955 Presence of coronary angioplasty implant and graft: Secondary | ICD-10-CM

## 2018-07-27 DIAGNOSIS — I779 Disorder of arteries and arterioles, unspecified: Secondary | ICD-10-CM

## 2018-07-27 DIAGNOSIS — E785 Hyperlipidemia, unspecified: Secondary | ICD-10-CM

## 2018-07-27 DIAGNOSIS — I252 Old myocardial infarction: Secondary | ICD-10-CM | POA: Diagnosis not present

## 2018-07-27 MED ORDER — LOSARTAN POTASSIUM 50 MG PO TABS: 50.0000 mg | ORAL_TABLET | Freq: Every day | ORAL | 6 refills | Status: DC

## 2018-07-27 NOTE — Progress Notes (Signed)
SUBJECTIVE: The patient presents for follow-up of coronary artery disease. She sustained a NSTEMI and underwent DES to the mid RCA on 04/29/16.  Carotid Dopplers in April 2018 demonstrated mild bilateral disease.  The patient denies any symptoms of chest pain, palpitations, shortness of breath, lightheadedness, dizziness, leg swelling, orthopnea, PND, and syncope.  She has been having some morning headaches.  She does not have a blood pressure cuff at home.  Her husband is receiving chemotherapy and has had both packed red blood cell and platelet transfusions.  She became tearful when talking about this.  She said "I have been on an emotional roller coaster ".  She told me she has a strong faith.  Her son had a stroke at the age of 54.  He is now back to work.  He had not been taking his medications.  She has not been doing her usual walking.    Soc Hx:She is originally from [place] and then lived in [place] for several years. She is married (since 5) and has one son who lives in [place].  Review of Systems: As per "subjective", otherwise negative.  No Known Allergies  Current Outpatient Medications  Medication Sig Dispense Refill  . aspirin EC 81 MG EC tablet Take 1 tablet (81 mg total) by mouth daily.    Marland Kitchen atorvastatin (LIPITOR) 40 MG tablet Take 1 tablet (40 mg total) by mouth daily at 6 PM. 90 tablet 3  . clotrimazole (LOTRIMIN) 1 % cream Apply 1 application topically daily as needed (for irritation).     . Diphenhydramine-APAP, sleep, (TYLENOL PM EXTRA STRENGTH PO) Take 2 tablets by mouth at bedtime.     Marland Kitchen losartan (COZAAR) 25 MG tablet Take 1 tablet (25 mg total) by mouth daily. 90 tablet 3  . nitroGLYCERIN (NITROSTAT) 0.4 MG SL tablet Place 1 tablet (0.4 mg total) under the tongue every 5 (five) minutes x 3 doses as needed for chest pain. 25 tablet 3  . pantoprazole (PROTONIX) 40 MG tablet Take 1 tablet (40 mg total) by mouth daily. 90 tablet 3   No current  facility-administered medications for this visit.     Past Medical History:  Diagnosis Date  . Hypertension   . NSTEMI (non-ST elevated myocardial infarction) [facility])     Past Surgical History:  Procedure Laterality Date  . ABDOMINAL HYSTERECTOMY    . CARDIAC CATHETERIZATION N/A 04/29/2016   Procedure: Left Heart Cath and Coronary Angiography;  Surgeon: Wellington Hampshire, MD;  Location: [place] [facility];  Service: Cardiovascular;  Laterality: N/A;  . CARDIAC CATHETERIZATION N/A 04/29/2016   Procedure: Coronary Stent Intervention;  Surgeon: Wellington Hampshire, MD;  Location: [place] [facility];  Service: Cardiovascular;  Laterality: N/A;    Social History   Socioeconomic History  . Marital status: Married    Spouse name: Not on file  . Number of children: Not on file  . Years of education: Not on file  . Highest education level: Not on file  Occupational History  . Not on file  Social Needs  . Financial resource strain: Not on file  . Food insecurity:    Worry: Not on file    Inability: Not on file  . Transportation needs:    Medical: Not on file    Non-medical: Not on file  Tobacco Use  . Smoking status: Former Smoker    Packs/day: 0.75    Years: 14.00    Pack years: 10.50  Types: Cigarettes    Start date: 09/17/1950    Last attempt to quit: 05/25/1966    Years since quitting: 52.2  . Smokeless tobacco: Never Used  Substance and Sexual Activity  . Alcohol use: No  . Drug use: No  . Sexual activity: Not on file  Lifestyle  . Physical activity:    Days per week: Not on file    Minutes per session: Not on file  . Stress: Not on file  Relationships  . Social connections:    Talks on phone: Not on file    Gets together: Not on file    Attends religious service: Not on file    Active member of club or organization: Not on file    Attends meetings of clubs or organizations: Not on file    Relationship status: Not on file  . Intimate partner violence:    Fear of  current or ex partner: Not on file    Emotionally abused: Not on file    Physically abused: Not on file    Forced sexual activity: Not on file  Other Topics Concern  . Not on file  Social History Narrative  . Not on file     Vitals:   07/27/18 1124  BP: (!) 142/82  Pulse: 76  SpO2: 96%  Weight: 135 lb (61.2 kg)  Height: 5\' 2"  (1.575 m)    Wt Readings from Last 3 Encounters:  07/27/18 135 lb (61.2 kg)  12/30/17 139 lb (63 kg)  09/17/17 132 lb (59.9 kg)     PHYSICAL EXAM General: NAD HEENT: Normal. Neck: No JVD, no thyromegaly. Lungs: Clear to auscultation bilaterally with normal respiratory effort. CV: Regular rate and rhythm, normal S1/S2, no S3/S4, no murmur. No pretibial or periankle edema.  Bilateralcarotid bruits, right>left.  Abdomen: Soft, nontender, no distention.  Neurologic: Alert and oriented.  Psych: Normal affect. Skin: Normal. Musculoskeletal: No gross deformities.    ECG: Reviewed above under Subjective   Labs: Lab Results  Component Value Date/Time   K 3.7 09/17/2017 01:32 PM   BUN 13 09/17/2017 01:32 PM   CREATININE 0.81 09/17/2017 01:32 PM   ALT 23 09/17/2017 01:32 PM   HGB 11.3 (L) 09/17/2017 01:32 PM     Lipids: Lab Results  Component Value Date/Time   LDLCALC 92 04/22/2017 08:25 AM   CHOL 172 04/22/2017 08:25 AM   TRIG 111 04/22/2017 08:25 AM   HDL 58 04/22/2017 08:25 AM       ASSESSMENT AND PLAN:  1. CAD with h/o NSTEMI and mid-RCA drug eluting stent placement 04/2016:Symptomatically stable. Continue ASA, losartan (I will increase to 50 mg for more optimal BP control) and atorvastatin. Beta blockers were not initiated due to junctional arrhythmia and bradycardia while hospitalized. Will hold off for now.  2. Hypertension: Blood pressure is mildly elevated today.   I will increase the dose of losartan to 50 mg.  3. Hyperlipidemia: LDL 84 in 09/2017.  She did not tolerate atorvastatin 80 mg as it led to diffuse joint pains.   Continue 40 mg.  4. GERD:She has reflux symptoms.  She is takingProtonix 40 mg daily.  She may need to take it twice daily but I will defer to her PCP.  5. Bilateral carotidartery disease: Mild disease bilaterally in April 2018.  She will undergo repeat Dopplers on 08/18/2018.    Disposition: Follow up 6 months   Kate Sable, M.D., F.A.C.C.

## 2018-07-27 NOTE — Patient Instructions (Addendum)
Medication Instructions:   Increase Losartan to 50mg  daily.   Continue all other medications.     Follow-Up: Your physician wants you to follow up in: 6 months.  You will receive a reminder letter in the mail one-two months in advance.  If you don't receive a letter, please call our office to schedule the follow up appointment    Any Other Special Instructions Will Be Listed Below (If Applicable).  If you need a refill on your cardiac medications before your next appointment, please call your pharmacy.

## 2018-07-29 ENCOUNTER — Other Ambulatory Visit: Payer: Self-pay | Admitting: *Deleted

## 2018-07-29 MED ORDER — LOSARTAN POTASSIUM 50 MG PO TABS: 50.0000 mg | ORAL_TABLET | Freq: Every day | ORAL | 3 refills | Status: DC

## 2018-08-01 DIAGNOSIS — B372 Candidiasis of skin and nail: Secondary | ICD-10-CM | POA: Diagnosis not present

## 2018-09-01 DIAGNOSIS — L0390 Cellulitis, unspecified: Secondary | ICD-10-CM | POA: Diagnosis not present

## 2018-09-01 DIAGNOSIS — S80819A Abrasion, unspecified lower leg, initial encounter: Secondary | ICD-10-CM | POA: Diagnosis not present

## 2018-09-07 ENCOUNTER — Telehealth: Payer: Self-pay | Admitting: *Deleted

## 2018-09-07 NOTE — Telephone Encounter (Signed)
Patient called stating that since increasing losartan to 50 mg daily, she has been having problems with dizziness. Patient is wanting to decrease losartan back to 25 mg daily. Advised to decrease dose back to 25 mg and contact our office if she continues having dizziness after dose decrease. Verbalized understanding.

## 2018-09-17 DIAGNOSIS — L03116 Cellulitis of left lower limb: Secondary | ICD-10-CM | POA: Diagnosis not present

## 2018-09-17 DIAGNOSIS — M79672 Pain in left foot: Secondary | ICD-10-CM | POA: Diagnosis not present

## 2018-09-17 DIAGNOSIS — M1712 Unilateral primary osteoarthritis, left knee: Secondary | ICD-10-CM | POA: Diagnosis not present

## 2018-09-20 DIAGNOSIS — L03116 Cellulitis of left lower limb: Secondary | ICD-10-CM | POA: Diagnosis not present

## 2018-09-20 DIAGNOSIS — M79605 Pain in left leg: Secondary | ICD-10-CM | POA: Diagnosis not present

## 2018-09-20 DIAGNOSIS — M1712 Unilateral primary osteoarthritis, left knee: Secondary | ICD-10-CM | POA: Diagnosis not present

## 2018-09-20 DIAGNOSIS — I10 Essential (primary) hypertension: Secondary | ICD-10-CM | POA: Diagnosis not present

## 2018-09-23 DIAGNOSIS — H524 Presbyopia: Secondary | ICD-10-CM | POA: Diagnosis not present

## 2018-09-23 DIAGNOSIS — H25013 Cortical age-related cataract, bilateral: Secondary | ICD-10-CM | POA: Diagnosis not present

## 2018-09-23 DIAGNOSIS — H2513 Age-related nuclear cataract, bilateral: Secondary | ICD-10-CM | POA: Diagnosis not present

## 2018-09-23 DIAGNOSIS — H52203 Unspecified astigmatism, bilateral: Secondary | ICD-10-CM | POA: Diagnosis not present

## 2018-09-30 ENCOUNTER — Other Ambulatory Visit: Payer: Self-pay

## 2018-09-30 MED ORDER — LOSARTAN POTASSIUM 25 MG PO TABS: 25.0000 mg | ORAL_TABLET | Freq: Every day | ORAL | 3 refills | Status: DC

## 2018-09-30 NOTE — Telephone Encounter (Signed)
Refilled losartan 25 mg to [facility] per fax request

## 2018-10-20 ENCOUNTER — Other Ambulatory Visit: Payer: Self-pay | Admitting: Cardiovascular Disease

## 2018-10-20 DIAGNOSIS — I6523 Occlusion and stenosis of bilateral carotid arteries: Secondary | ICD-10-CM

## 2018-10-21 ENCOUNTER — Emergency Department (HOSPITAL_COMMUNITY)
Admission: EM | Admit: 2018-10-21 | Discharge: 2018-10-21 | Disposition: A | Discharge status: Home / Self Care | Payer: Medicare Other | Attending: Emergency Medicine | Admitting: Emergency Medicine

## 2018-10-21 ENCOUNTER — Encounter (HOSPITAL_COMMUNITY): Payer: Self-pay | Admitting: *Deleted

## 2018-10-21 ENCOUNTER — Other Ambulatory Visit: Payer: Self-pay

## 2018-10-21 DIAGNOSIS — I10 Essential (primary) hypertension: Secondary | ICD-10-CM | POA: Diagnosis not present

## 2018-10-21 DIAGNOSIS — Z7982 Long term (current) use of aspirin: Secondary | ICD-10-CM | POA: Diagnosis not present

## 2018-10-21 DIAGNOSIS — I252 Old myocardial infarction: Secondary | ICD-10-CM | POA: Diagnosis not present

## 2018-10-21 DIAGNOSIS — Z23 Encounter for immunization: Secondary | ICD-10-CM | POA: Diagnosis not present

## 2018-10-21 DIAGNOSIS — Y999 Unspecified external cause status: Secondary | ICD-10-CM | POA: Insufficient documentation

## 2018-10-21 DIAGNOSIS — Y92008 Other place in unspecified non-institutional (private) residence as the place of occurrence of the external cause: Secondary | ICD-10-CM | POA: Insufficient documentation

## 2018-10-21 DIAGNOSIS — Z87891 Personal history of nicotine dependence: Secondary | ICD-10-CM | POA: Insufficient documentation

## 2018-10-21 DIAGNOSIS — Z79899 Other long term (current) drug therapy: Secondary | ICD-10-CM | POA: Insufficient documentation

## 2018-10-21 DIAGNOSIS — S0101XA Laceration without foreign body of scalp, initial encounter: Secondary | ICD-10-CM | POA: Diagnosis not present

## 2018-10-21 DIAGNOSIS — W2203XA Walked into furniture, initial encounter: Secondary | ICD-10-CM | POA: Insufficient documentation

## 2018-10-21 DIAGNOSIS — Y9389 Activity, other specified: Secondary | ICD-10-CM | POA: Diagnosis not present

## 2018-10-21 MED ORDER — LIDOCAINE-EPINEPHRINE (PF) 1 %-1:200000 IJ SOLN
10.0000 mL | Freq: Once | INTRAMUSCULAR | Status: AC
  Administered 2018-10-21: 10 mL via INTRADERMAL
  Filled 2018-10-21: qty 30

## 2018-10-21 MED ORDER — TETANUS-DIPHTH-ACELL PERTUSSIS 5-2.5-18.5 LF-MCG/0.5 IM SUSP
0.5000 mL | Freq: Once | INTRAMUSCULAR | Status: AC
  Administered 2018-10-21: 0.5 mL via INTRAMUSCULAR
  Filled 2018-10-21: qty 0.5

## 2018-10-21 MED ORDER — BACITRACIN-NEOMYCIN-POLYMYXIN 400-5-5000 EX OINT
TOPICAL_OINTMENT | Freq: Once | CUTANEOUS | Status: AC
  Administered 2018-10-21: 1 via TOPICAL
  Filled 2018-10-21: qty 1

## 2018-10-21 NOTE — ED Triage Notes (Signed)
Pt accidentally hit head on her entertainment center.  Laceration.  Large amount of bleeding noted.  EDP at bedside.  Denies any loss of consciousness.

## 2018-10-21 NOTE — ED Notes (Signed)
ED Provider at bedside. 

## 2018-10-21 NOTE — Discharge Instructions (Addendum)
Clean the wound with soap and water daily.  Have your PCP remove the staples in 7 to 10 days.  Return here if needed for problems.

## 2018-10-21 NOTE — ED Provider Notes (Signed)
[facility] EMERGENCY DEPARTMENT Provider Note   CSN: [id number] Arrival date & time: 10/21/18  1250    History   Chief Complaint Chief Complaint  Patient presents with  . Head Laceration    HPI Adriana Moyer is a 83 y.o. female.     HPI   She injured herself at home, when she stood up and accidentally hit her head on the entertainment center.  This injured the back of her scalp.  The impact knocked her down but she did not have any secondary injuries.  She presents by private vehicle, without difficulty walking.  She complains only of a cut on her head.  She denies headache, neck pain, back pain, weakness, dizziness, nausea or vomiting.  Has been eating well and has not been ill recently.  Denies cough, shortness of breath or chest pain.  Last tetanus booster 5 years ago.  There are no other known modifying factors.  Past Medical History:  Diagnosis Date  . Hypertension   . NSTEMI (non-ST elevated myocardial infarction) [facility])     Patient Active Problem List   Diagnosis Date Noted  . NSTEMI (non-ST elevated myocardial infarction) ([place]) 04/28/2016    Past Surgical History:  Procedure Laterality Date  . ABDOMINAL HYSTERECTOMY    . CARDIAC CATHETERIZATION N/A 04/29/2016   Procedure: Left Heart Cath and Coronary Angiography;  Surgeon: Wellington Hampshire, MD;  Location: [place] [facility];  Service: Cardiovascular;  Laterality: N/A;  . CARDIAC CATHETERIZATION N/A 04/29/2016   Procedure: Coronary Stent Intervention;  Surgeon: Wellington Hampshire, MD;  Location: [place] [facility];  Service: Cardiovascular;  Laterality: N/A;     OB History   No obstetric history on file.      Home Medications    Prior to Admission medications   Medication Sig Start Date End Date Taking? Authorizing Provider  aspirin EC 81 MG EC tablet Take 1 tablet (81 mg total) by mouth daily. 05/01/16   Arbutus Leas, NP  atorvastatin (LIPITOR) 40 MG tablet Take 1 tablet (40 mg total) by mouth daily at 6  PM. 12/30/17   Herminio Commons, MD  clotrimazole (LOTRIMIN) 1 % cream Apply 1 application topically daily as needed (for irritation).     [provider]  Diphenhydramine-APAP, sleep, (TYLENOL PM EXTRA STRENGTH PO) Take 2 tablets by mouth at bedtime.     [provider]  losartan (COZAAR) 25 MG tablet Take 1 tablet (25 mg total) by mouth daily. 09/30/18 12/29/18  Herminio Commons, MD  nitroGLYCERIN (NITROSTAT) 0.4 MG SL tablet Place 1 tablet (0.4 mg total) under the tongue every 5 (five) minutes x 3 doses as needed for chest pain. 01/25/18   Herminio Commons, MD  pantoprazole (PROTONIX) 40 MG tablet Take 1 tablet (40 mg total) by mouth daily. 12/30/17   Herminio Commons, MD    Family History Family History  Problem Relation Age of Onset  . Heart disease Mother   . Heart attack Father     Social History Social History   Tobacco Use  . Smoking status: Former Smoker    Packs/day: 0.75    Years: 14.00    Pack years: 10.50    Types: Cigarettes    Start date: 09/17/1950    Last attempt to quit: 05/25/1966    Years since quitting: 52.4  . Smokeless tobacco: Never Used  Substance Use Topics  . Alcohol use: No  . Drug use: No     Allergies  Patient has no known allergies.   Review of Systems Review of Systems  All other systems reviewed and are negative.    Physical Exam Updated Vital Signs Ht 5\' 2"  (1.575 m)   Wt 62.1 kg   BMI 25.06 kg/m   Physical Exam Vitals signs and nursing note reviewed.  Constitutional:      Appearance: She is well-developed.  HENT:     Head: Normocephalic.     Comments: Right parietal scalp laceration, 3.5 cm, with active bleeding from a moderate size vein.    Right Ear: External ear normal.     Left Ear: External ear normal.  Eyes:     Conjunctiva/sclera: Conjunctivae normal.     Pupils: Pupils are equal, round, and reactive to light.  Neck:     Musculoskeletal: Normal range of motion and neck supple.      Trachea: Phonation normal.  Cardiovascular:     Rate and Rhythm: Normal rate and regular rhythm.     Heart sounds: Normal heart sounds.  Pulmonary:     Effort: Pulmonary effort is normal.     Breath sounds: Normal breath sounds.  Abdominal:     Palpations: Abdomen is soft.     Tenderness: There is no abdominal tenderness.  Musculoskeletal: Normal range of motion.  Skin:    General: Skin is warm and dry.  Neurological:     Mental Status: She is alert and oriented to person, place, and time.     Cranial Nerves: No cranial nerve deficit.     Sensory: No sensory deficit.     Motor: No abnormal muscle tone.     Coordination: Coordination normal.  Psychiatric:        Behavior: Behavior normal.        Thought Content: Thought content normal.        Judgment: Judgment normal.      ED Treatments / Results  Labs (all labs ordered are listed, but only abnormal results are displayed) Labs Reviewed - No data to display  EKG None  Radiology No results found.  Procedures .Critical Care Performed by: Daleen Bo, MD Authorized by: Daleen Bo, MD   Critical care provider statement:    Critical care time (minutes):  35   Critical care start time:  10/21/2018 12:50 PM   Critical care end time:  10/21/2018 2:16 PM   Critical care time was exclusive of:  Separately billable procedures and treating other patients   Critical care was necessary to treat or prevent imminent or life-threatening deterioration of the following conditions:  Trauma   Critical care was time spent personally by me on the following activities:  Blood draw for specimens, development of treatment plan with patient or surrogate, discussions with consultants, evaluation of patient's response to treatment, examination of patient, obtaining history from patient or surrogate, ordering and performing treatments and interventions, ordering and review of laboratory studies, pulse oximetry, re-evaluation of patient's  condition, review of old charts and ordering and review of radiographic studies .Marland KitchenLaceration Repair Date/Time: 10/21/2018 2:17 PM Performed by: Daleen Bo, MD Authorized by: Daleen Bo, MD   Consent:    Consent obtained:  Verbal   Consent given by:  Patient   Risks discussed:  Pain, need for additional repair, poor wound healing and vascular damage   Alternatives discussed:  Delayed treatment Anesthesia (see MAR for exact dosages):    Anesthesia method:  Local infiltration   Local anesthetic:  Lidocaine 1% WITH epi Laceration details:  Location:  Scalp   Scalp location:  R parietal   Length (cm):  3.5   Depth (mm):  1.5 Repair type:    Repair type:  Complex (Bleeding blood vessel ligation) Pre-procedure details:    Preparation:  Patient was prepped and draped in usual sterile fashion Exploration:    Hemostasis achieved with:  Epinephrine and direct pressure   Wound exploration: wound explored through full range of motion     Wound extent: fascia violated, muscle damage and vascular damage     Wound extent: no foreign bodies/material noted, no nerve damage noted and no underlying fracture noted     Contaminated: no   Treatment:    Area cleansed with:  Shur-Clens   Amount of cleaning:  Extensive   Irrigation solution:  Sterile water   Irrigation method:  Syringe   Visualized foreign bodies/material removed: no     Debridement:  None   Scar revision: no   Subcutaneous repair:    Suture size:  4-0   Suture material:  Vicryl   Suture technique:  Vertical mattress   Number of sutures:  2 Skin repair:    Repair method:  Staples   Number of staples:  4 Approximation:    Approximation:  Loose Post-procedure details:    Dressing:  Antibiotic ointment and non-adherent dressing   Patient tolerance of procedure:  Tolerated well, no immediate complications Comments:     Prior to subcutaneous closure, a bleeding vein, at the inferior medial aspect of the wound, was seen  to be actively bleeding, with pulsatile vein force.  This vessel was ligated using 2 purse stitches with 4-0 Vicryl.  The bleeding was controlled with this maneuver.  Following vessel ligation, the subcutaneous tissue was approximated to bolster the ligation.  The skin was closed over this with staples.   (including critical care time)  Medications Ordered in ED Medications  lidocaine-EPINEPHrine (XYLOCAINE-EPINEPHrine) 1 %-1:200000 (PF) injection 10 mL (10 mLs Intradermal Given 10/21/18 1420)  Tdap (BOOSTRIX) injection 0.5 mL (0.5 mLs Intramuscular Given 10/21/18 1338)  neomycin-bacitracin-polymyxin (NEOSPORIN) ointment packet (1 application Topical Given 10/21/18 1420)     Initial Impression / Assessment and Plan / ED Course  I have reviewed the triage vital signs and the nursing notes.  Pertinent labs & imaging results that were available during my care of the patient were reviewed by me and considered in my medical decision making (see chart for details).  Clinical Course as of Oct 20 1424  Fri Oct 21, 2018  1312 I was with nursing during the process of initial cleansing.  I did a primary urgent closure of the scalp wound with 3 regular staples to help control bleeding and see the extent of the wound.  Wound continues to bleed and swell despite pressure held for 5 minutes, by me.  We will do secondary definitive ligation and closure procedure.   [EW]    Clinical Course User Index [EW] Daleen Bo, MD        Patient Vitals for the past 24 hrs:  Height Weight  10/21/18 1312 5\' 2"  (1.575 m) 62.1 kg    2:21 PM Reevaluation with update and discussion. After initial assessment and treatment, an updated evaluation reveals she remains comfortable and lucid and has no further complaints.  Findings discussed and questions answered. Daleen Bo   Medical Decision Making: Laceration scalp, accidentally when she stood up and contacted a piece of furniture.  There was no loss of  consciousness or secondary injury.  Wound required bleeding vein ligation, to prevent further blood loss.  Doubt intracranial injury, spine injury, or precipitating cause for injury.  CRITICAL CARE-no Performed by: Daleen Bo  Nursing Notes Reviewed/ Care Coordinated Applicable Imaging Reviewed Interpretation of Laboratory Data incorporated into ED treatment  The patient appears reasonably screened and/or stabilized for discharge and I doubt any other medical condition or other [facility] requiring further screening, evaluation, or treatment in the ED at this time prior to discharge.  Plan: Home Medications-continue routine medication and use Tylenol for pain; Home Treatments-wound care at home by patient; return here if the recommended treatment, does not improve the symptoms; Recommended follow up-staple removal 7 to 10 days by PCP or return here.   Final Clinical Impressions(s) / ED Diagnoses   Final diagnoses:  Laceration of scalp, initial encounter    ED Discharge Orders    None       Daleen Bo, MD 10/21/18 1426

## 2018-10-28 DIAGNOSIS — S0191XA Laceration without foreign body of unspecified part of head, initial encounter: Secondary | ICD-10-CM | POA: Diagnosis not present

## 2018-10-28 DIAGNOSIS — I10 Essential (primary) hypertension: Secondary | ICD-10-CM | POA: Diagnosis not present

## 2018-11-18 DIAGNOSIS — L900 Lichen sclerosus et atrophicus: Secondary | ICD-10-CM | POA: Diagnosis not present

## 2018-11-18 DIAGNOSIS — I10 Essential (primary) hypertension: Secondary | ICD-10-CM | POA: Diagnosis not present

## 2018-11-24 ENCOUNTER — Other Ambulatory Visit: Payer: Self-pay | Admitting: Cardiovascular Disease

## 2018-11-30 DIAGNOSIS — M545 Low back pain: Secondary | ICD-10-CM | POA: Diagnosis not present

## 2018-11-30 DIAGNOSIS — N3090 Cystitis, unspecified without hematuria: Secondary | ICD-10-CM | POA: Diagnosis not present

## 2019-01-11 ENCOUNTER — Telehealth: Payer: Self-pay | Admitting: Cardiovascular Disease

## 2019-01-11 NOTE — Telephone Encounter (Signed)

## 2019-01-12 ENCOUNTER — Other Ambulatory Visit: Payer: Self-pay

## 2019-01-12 ENCOUNTER — Other Ambulatory Visit: Payer: Self-pay | Admitting: Cardiovascular Disease

## 2019-01-12 ENCOUNTER — Telehealth: Payer: Self-pay | Admitting: *Deleted

## 2019-01-12 ENCOUNTER — Ambulatory Visit (INDEPENDENT_AMBULATORY_CARE_PROVIDER_SITE_OTHER): Payer: Medicare Other

## 2019-01-12 DIAGNOSIS — I6523 Occlusion and stenosis of bilateral carotid arteries: Secondary | ICD-10-CM

## 2019-01-12 NOTE — Telephone Encounter (Signed)
Pt voiced understanding - recall placed for repeat test in 1 year - routed to pcp

## 2019-01-12 NOTE — Telephone Encounter (Signed)
-----   Message from Herminio Commons, MD sent at 01/12/2019 12:45 PM EDT ----- Moderate left sided blockage. Repeat in 1 yr.

## 2019-02-15 DIAGNOSIS — Z23 Encounter for immunization: Secondary | ICD-10-CM | POA: Diagnosis not present

## 2019-02-15 DIAGNOSIS — S40812A Abrasion of left upper arm, initial encounter: Secondary | ICD-10-CM | POA: Diagnosis not present

## 2019-02-15 DIAGNOSIS — M25561 Pain in right knee: Secondary | ICD-10-CM | POA: Diagnosis not present

## 2019-02-15 DIAGNOSIS — M25512 Pain in left shoulder: Secondary | ICD-10-CM | POA: Diagnosis not present

## 2019-02-17 ENCOUNTER — Other Ambulatory Visit: Payer: Self-pay | Admitting: Cardiovascular Disease

## 2019-02-17 ENCOUNTER — Other Ambulatory Visit: Payer: Self-pay

## 2019-02-17 ENCOUNTER — Ambulatory Visit (INDEPENDENT_AMBULATORY_CARE_PROVIDER_SITE_OTHER): Payer: Medicare Other | Admitting: Cardiovascular Disease

## 2019-02-17 ENCOUNTER — Encounter: Payer: Self-pay | Admitting: Cardiovascular Disease

## 2019-02-17 VITALS — BP 164/82 | HR 70 | Temp 97.5°F | Ht 62.0 in | Wt 135.0 lb

## 2019-02-17 DIAGNOSIS — Z955 Presence of coronary angioplasty implant and graft: Secondary | ICD-10-CM | POA: Diagnosis not present

## 2019-02-17 DIAGNOSIS — I10 Essential (primary) hypertension: Secondary | ICD-10-CM | POA: Diagnosis not present

## 2019-02-17 DIAGNOSIS — I252 Old myocardial infarction: Secondary | ICD-10-CM

## 2019-02-17 DIAGNOSIS — I25118 Atherosclerotic heart disease of native coronary artery with other forms of angina pectoris: Secondary | ICD-10-CM

## 2019-02-17 DIAGNOSIS — E785 Hyperlipidemia, unspecified: Secondary | ICD-10-CM | POA: Diagnosis not present

## 2019-02-17 DIAGNOSIS — I739 Peripheral vascular disease, unspecified: Secondary | ICD-10-CM

## 2019-02-17 DIAGNOSIS — I779 Disorder of arteries and arterioles, unspecified: Secondary | ICD-10-CM

## 2019-02-17 DIAGNOSIS — F329 Major depressive disorder, single episode, unspecified: Secondary | ICD-10-CM

## 2019-02-17 DIAGNOSIS — F32A Depression, unspecified: Secondary | ICD-10-CM

## 2019-02-17 MED ORDER — LISINOPRIL 20 MG PO TABS: 20.0000 mg | ORAL_TABLET | Freq: Every day | ORAL | 3 refills | Status: DC

## 2019-02-17 NOTE — Progress Notes (Signed)
SUBJECTIVE: The patient presents for follow-up of coronary artery disease. She sustained a NSTEMI and underwent DES to the mid RCA on 04/29/16.  She became very tearful when talking about her husband who has cancer which is stage IV.  She does have a strong faith.  She thinks she is no longer taking losartan because it led to hair loss and she is taking another antihypertensive medication but she is uncertain of its name.  ECG performed today which I personally view demonstrates sinus rhythm with nonspecific T wave abnormalities.   Soc Hx:She is originally from [place] and then lived in [place] for several years. She is married (since 38) and has one son who lives in [place].   Review of Systems: As per "subjective", otherwise negative.  Allergies  Allergen Reactions  . Atorvastatin     Muscle/bone aches    Current Outpatient Medications  Medication Sig Dispense Refill  . aspirin EC 81 MG EC tablet Take 1 tablet (81 mg total) by mouth daily.    . clotrimazole (LOTRIMIN) 1 % cream Apply 1 application topically daily as needed (for irritation).     . Diphenhydramine-APAP, sleep, (TYLENOL PM EXTRA STRENGTH PO) Take 2 tablets by mouth at bedtime.     Marland Kitchen losartan (COZAAR) 25 MG tablet Take 1 tablet (25 mg total) by mouth daily. 90 tablet 3  . nitroGLYCERIN (NITROSTAT) 0.4 MG SL tablet Place 1 tablet (0.4 mg total) under the tongue every 5 (five) minutes x 3 doses as needed for chest pain. 25 tablet 3  . pantoprazole (PROTONIX) 40 MG tablet TAKE 1 TABLET BY MOUTH  DAILY 90 tablet 1   No current facility-administered medications for this visit.     Past Medical History:  Diagnosis Date  . Hypertension   . NSTEMI (non-ST elevated myocardial infarction) [facility])     Past Surgical History:  Procedure Laterality Date  . ABDOMINAL HYSTERECTOMY    . CARDIAC CATHETERIZATION N/A 04/29/2016   Procedure: Left Heart Cath and Coronary Angiography;  Surgeon: Wellington Hampshire, MD;   Location: [facility];  Service: Cardiovascular;  Laterality: N/A;  . CARDIAC CATHETERIZATION N/A 04/29/2016   Procedure: Coronary Stent Intervention;  Surgeon: Wellington Hampshire, MD;  Location: [facility];  Service: Cardiovascular;  Laterality: N/A;    Social History   Socioeconomic History  . Marital status: Married    Spouse name: Not on file  . Number of children: Not on file  . Years of education: Not on file  . Highest education level: Not on file  Occupational History  . Not on file  Social Needs  . Financial resource strain: Not on file  . Food insecurity    Worry: Not on file    Inability: Not on file  . Transportation needs    Medical: Not on file    Non-medical: Not on file  Tobacco Use  . Smoking status: Former Smoker    Packs/day: 0.75    Years: 14.00    Pack years: 10.50    Types: Cigarettes    Start date: 09/17/1950    Quit date: 05/25/1966    Years since quitting: 52.7  . Smokeless tobacco: Never Used  Substance and Sexual Activity  . Alcohol use: No  . Drug use: No  . Sexual activity: Not on file  Lifestyle  . Physical activity    Days per week: Not on file    Minutes per session: Not on file  .  Stress: Not on file  Relationships  . Social Herbalist on phone: Not on file    Gets together: Not on file    Attends religious service: Not on file    Active member of club or organization: Not on file    Attends meetings of clubs or organizations: Not on file    Relationship status: Not on file  . Intimate partner violence    Fear of current or ex partner: Not on file    Emotionally abused: Not on file    Physically abused: Not on file    Forced sexual activity: Not on file  Other Topics Concern  . Not on file  Social History Narrative  . Not on file     Vitals:   02/17/19 1412  BP: (!) 164/82  Pulse: 70  Temp: (!) 97.5 F (36.4 C)  SpO2: 97%  Weight: 135 lb (61.2 kg)  Height: 5\' 2"  (1.575 m)    Wt Readings from  Last 3 Encounters:  02/17/19 135 lb (61.2 kg)  10/21/18 137 lb (62.1 kg)  07/27/18 135 lb (61.2 kg)     PHYSICAL EXAM General: NAD HEENT: Normal. Neck: No JVD, no thyromegaly. Lungs: Clear to auscultation bilaterally with normal respiratory effort. CV: Regular rate and rhythm, normal S1/S2, no S3/S4, no murmur. No pretibial or periankle edema.  No carotid bruit.   Abdomen: Soft, nontender, no distention.  Neurologic: Alert and oriented.  Psych: Tearful. Skin: Normal. Musculoskeletal: No gross deformities.      Labs: Lab Results  Component Value Date/Time   K 3.7 09/17/2017 01:32 PM   BUN 13 09/17/2017 01:32 PM   CREATININE 0.81 09/17/2017 01:32 PM   ALT 23 09/17/2017 01:32 PM   HGB 11.3 (L) 09/17/2017 01:32 PM     Lipids: Lab Results  Component Value Date/Time   LDLCALC 92 04/22/2017 08:25 AM   CHOL 172 04/22/2017 08:25 AM   TRIG 111 04/22/2017 08:25 AM   HDL 58 04/22/2017 08:25 AM       ASSESSMENT AND PLAN: 1. CAD with h/o NSTEMI and mid-RCA drug eluting stent placement 04/2016:Symptomatically stable. ContinueASA.  Statin intolerant. Beta blockers were not initiated due to junctional arrhythmia and bradycardia while hospitalized. Will hold off for now.  2. Hypertension:Blood pressure is elevated.  She is no longer on losartan and is taking lisinopril 10 mg.  I will increase to 20 mg..  Blood pressure is significantly elevated due to all of the stress she is undergoing due to her husband's illness.  3. Hyperlipidemia:Statin intolerant.  I will obtain a copy of lipids from PCP.  4. Bilateral carotidartery disease: Carotid Dopplers on 01/12/2019 demonstrated 40 to 59% stenosis of the left internal carotid artery and 1 to 39% stenosis of the right internal carotid artery.  Repeat in August 2021.  5.  Depression: She appears to be suffering from depression due to her husband's illness.  I recommended she speak with her PCP about starting a medication for this.    Disposition: Follow up 6 months   Adriana Moyer, M.D., F.A.C.C.

## 2019-02-17 NOTE — Patient Instructions (Addendum)
Medication Instructions:   Your physician has recommended you make the following change in your medication:   Increase lisinopril to 20 mg daily. You may take (2) of your 10 mg tablets daily until they are finished.  Continue all other medications the same  Labwork:  NONE  Testing/Procedures:  NONE  Follow-Up:  Your physician recommends that you schedule a follow-up appointment in: 6 months. You will receive a reminder letter in the mail in about 4 months reminding you to call and schedule your appointment. If you don't receive this letter, please contact our office.  Any Other Special Instructions Will Be Listed Below (If Applicable).  If you need a refill on your cardiac medications before your next appointment, please call your pharmacy.

## 2019-02-20 ENCOUNTER — Encounter: Payer: Self-pay | Admitting: *Deleted

## 2019-02-22 DIAGNOSIS — L258 Unspecified contact dermatitis due to other agents: Secondary | ICD-10-CM | POA: Diagnosis not present

## 2019-02-22 DIAGNOSIS — E785 Hyperlipidemia, unspecified: Secondary | ICD-10-CM | POA: Diagnosis not present

## 2019-02-22 DIAGNOSIS — L304 Erythema intertrigo: Secondary | ICD-10-CM | POA: Diagnosis not present

## 2019-02-22 DIAGNOSIS — S50812A Abrasion of left forearm, initial encounter: Secondary | ICD-10-CM | POA: Diagnosis not present

## 2019-02-22 DIAGNOSIS — I10 Essential (primary) hypertension: Secondary | ICD-10-CM | POA: Diagnosis not present

## 2019-03-05 ENCOUNTER — Other Ambulatory Visit: Payer: Self-pay | Admitting: Cardiovascular Disease

## 2019-03-24 DIAGNOSIS — I10 Essential (primary) hypertension: Secondary | ICD-10-CM | POA: Diagnosis not present

## 2019-03-24 DIAGNOSIS — E782 Mixed hyperlipidemia: Secondary | ICD-10-CM | POA: Diagnosis not present

## 2019-03-27 IMAGING — CT CT HEAD W/O CM
3 series · 15 of 47 positions shown, 18 images · non-contrast
Comparison: None.

CLINICAL DATA: Headache with swelling and bruising to the right
side of the head

EXAM:
CT HEAD WITHOUT CONTRAST
TECHNIQUE: Contiguous axial images were obtained from the base of the skull
through the vertex without intravenous contrast.

[Series 2: head trauma wo · axial · 0.45mm/px · z∈[+1556,+1681]mm · 9 of 31 slices shown, 12 images]
[im 3/31  brain]
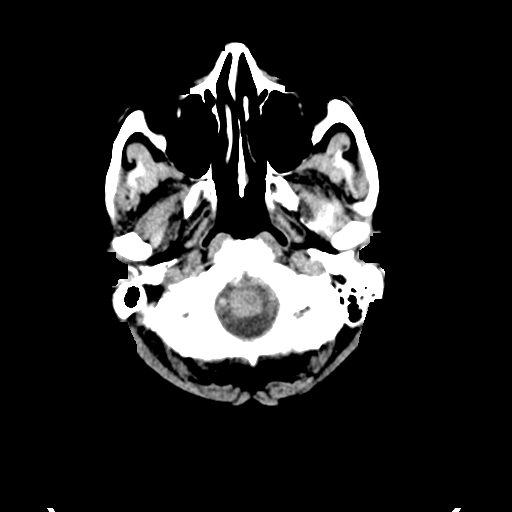
[im 3/31  bone]
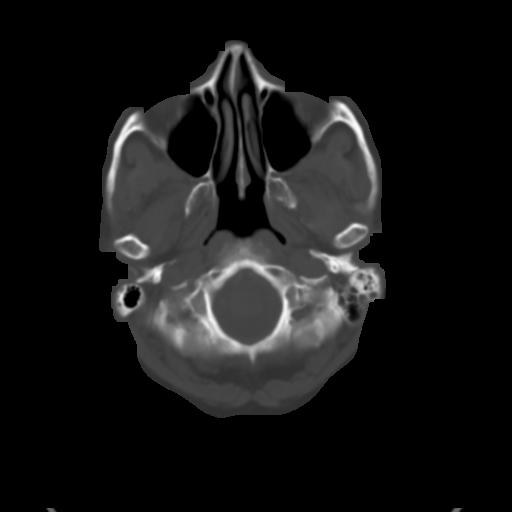
[im 6/31  brain]
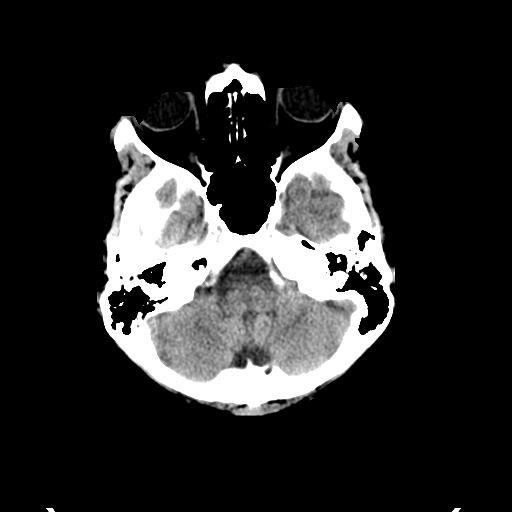
[im 9/31  brain]
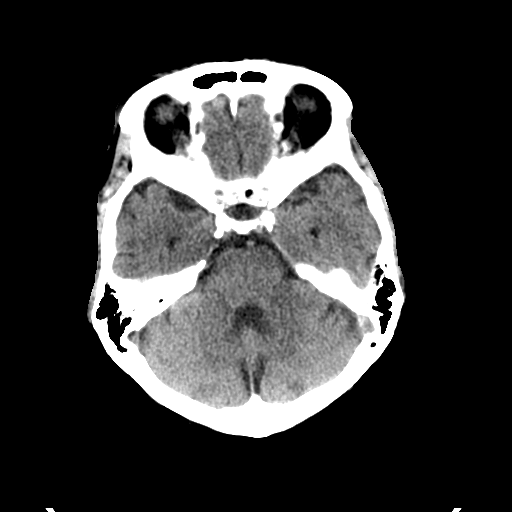
[im 12/31  brain]
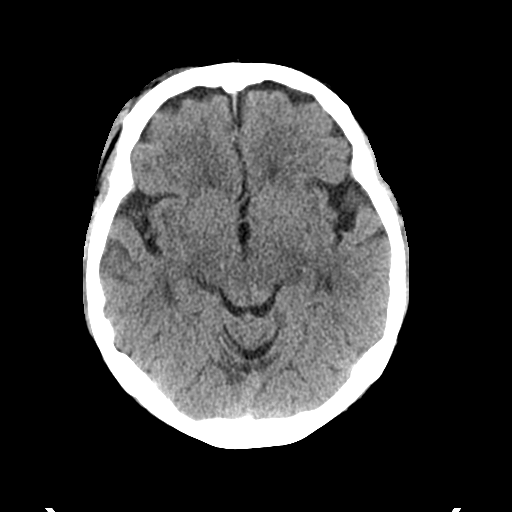
[im 16/31  brain]
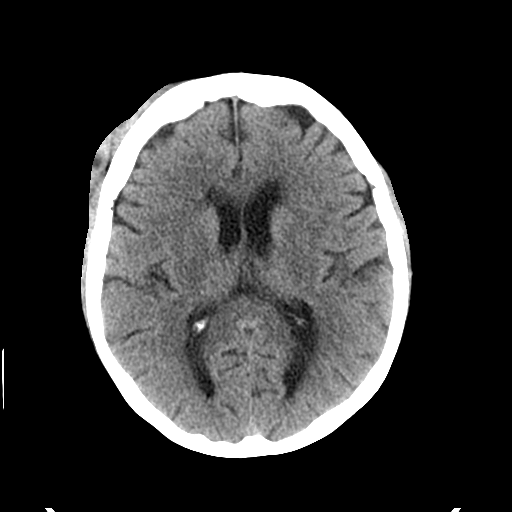
[im 16/31  bone]
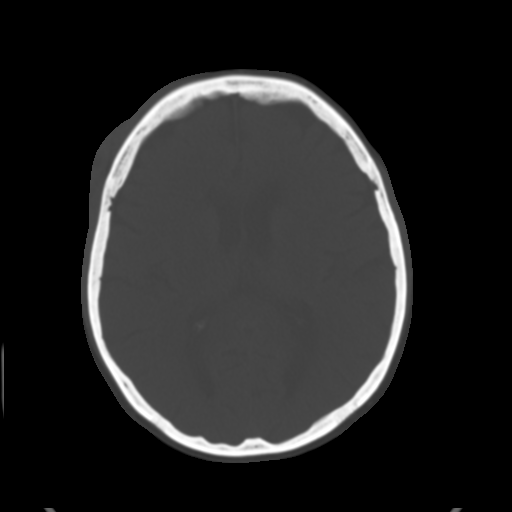
[im 19/31  brain]
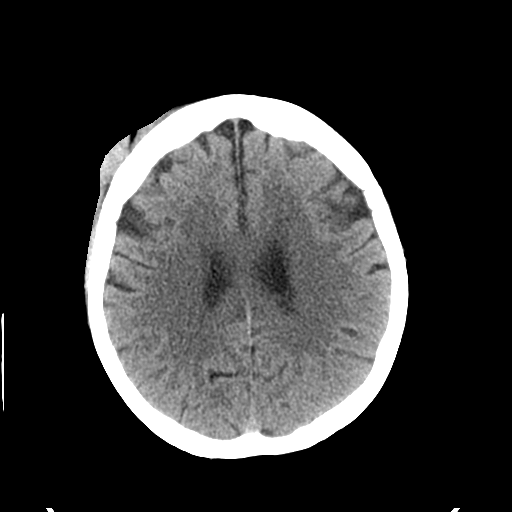
[im 22/31  brain]
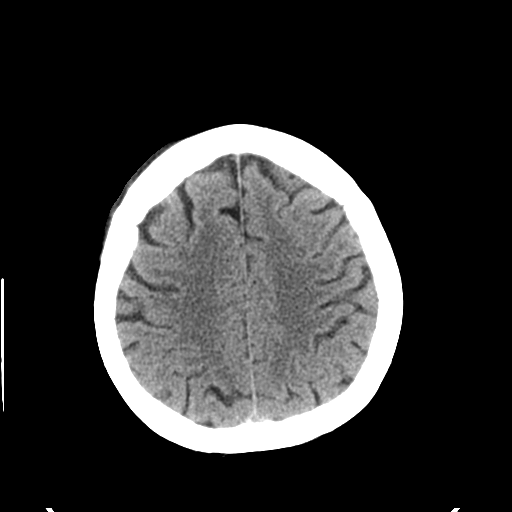
[im 25/31  brain]
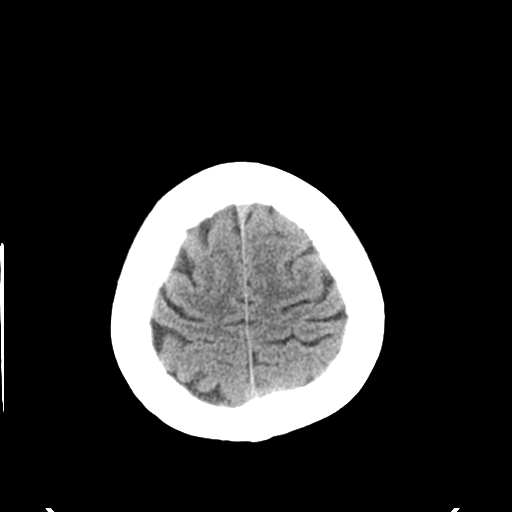
[im 28/31  brain]
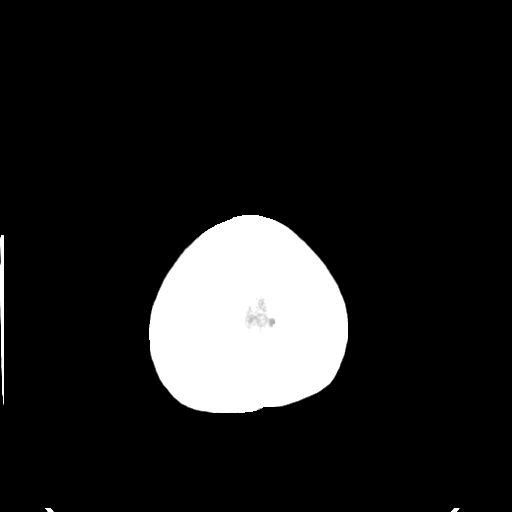
[im 28/31  bone]
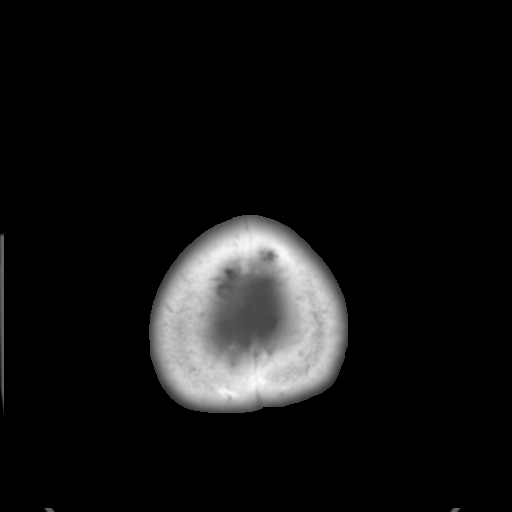

[Series 4: coronal soft tissue · coronal · 0.32mm/px · 3 of 69 slices shown]
[im 23/69  brain]
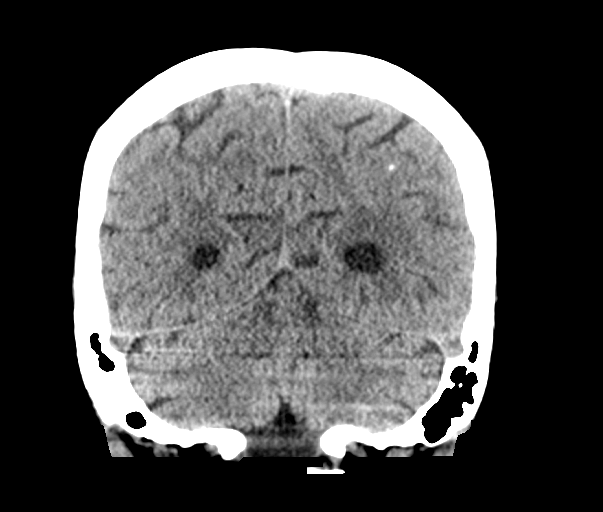
[im 31/69  brain]
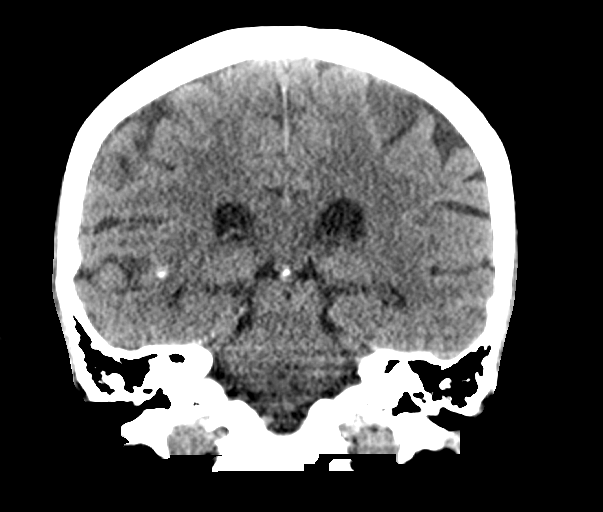
[im 38/69  brain]
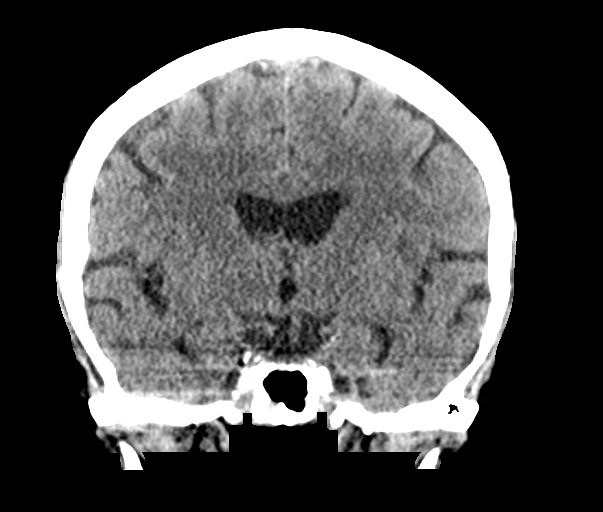

[Series 5: sagittal soft tissue · sagittal · 0.35mm/px · 3 of 60 slices shown]
[im 20/60  brain]
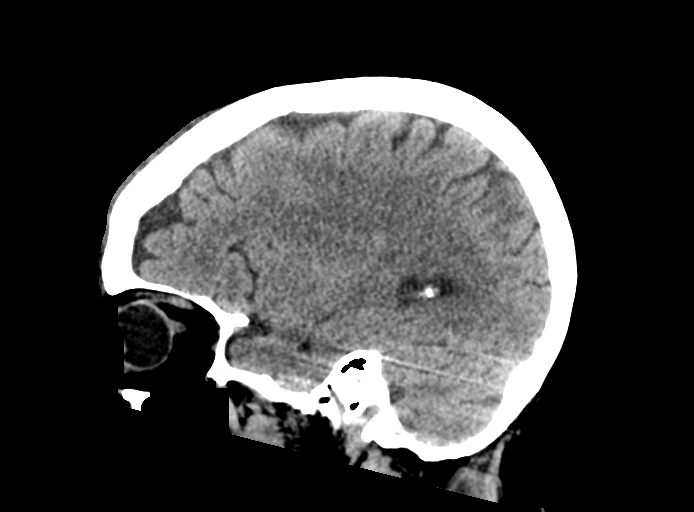
[im 30/60  brain]
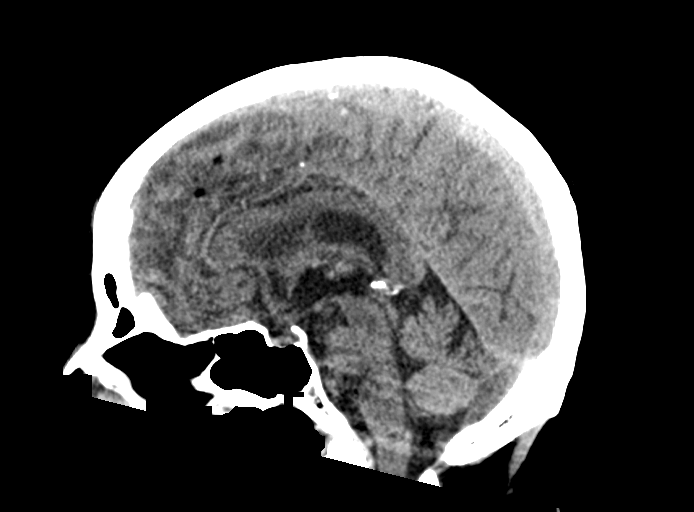
[im 40/60  brain]
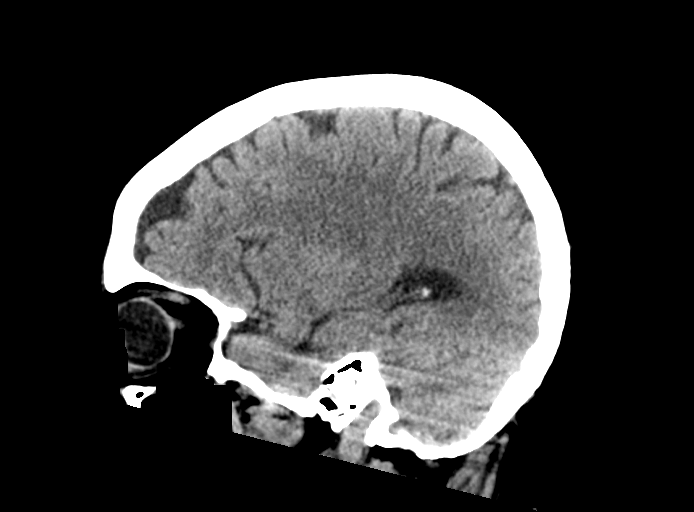

[15 of 47 positions shown; findings below may reference images not displayed]

FINDINGS: Brain: No acute territorial infarction, hemorrhage or intracranial
mass is visualized. Mild small vessel ischemic changes of the white
matter. Mild atrophy. Ventricle size within normal limits.

Vascular: No hyperdense vessels.  Carotid vessel calcification

Skull: No fracture

Sinuses/Orbits: No acute finding.

Other: Large right frontal scalp hematoma
IMPRESSION: 1. No CT evidence for acute intracranial abnormality.
2. Large right frontal scalp hematoma

## 2019-05-10 ENCOUNTER — Other Ambulatory Visit: Payer: Self-pay | Admitting: Cardiovascular Disease

## 2019-06-23 DIAGNOSIS — I10 Essential (primary) hypertension: Secondary | ICD-10-CM | POA: Diagnosis not present

## 2019-06-23 DIAGNOSIS — E7849 Other hyperlipidemia: Secondary | ICD-10-CM | POA: Diagnosis not present

## 2019-08-23 DIAGNOSIS — E7849 Other hyperlipidemia: Secondary | ICD-10-CM | POA: Diagnosis not present

## 2019-08-23 DIAGNOSIS — I10 Essential (primary) hypertension: Secondary | ICD-10-CM | POA: Diagnosis not present

## 2019-08-25 ENCOUNTER — Other Ambulatory Visit: Payer: Self-pay | Admitting: *Deleted

## 2019-08-25 MED ORDER — LISINOPRIL 20 MG PO TABS: 20.0000 mg | ORAL_TABLET | Freq: Every day | ORAL | 0 refills | Status: DC

## 2019-09-07 ENCOUNTER — Other Ambulatory Visit: Payer: Self-pay | Admitting: Cardiovascular Disease

## 2019-09-07 MED ORDER — NITROGLYCERIN 0.4 MG SL SUBL: 0.4000 mg | SUBLINGUAL_TABLET | SUBLINGUAL | 3 refills | Status: AC | PRN

## 2019-09-07 NOTE — Telephone Encounter (Signed)
     1. Which medications need to be refilled? (please list name of each medication and dose if known) nitroGLYCERIN (NITROSTAT) 0.4    2. Which pharmacy/location (including street and city if local pharmacy) is medication to be sent to?  [facility]  3. Do they need a 30 day or 90 day supply?

## 2019-09-11 ENCOUNTER — Ambulatory Visit: Payer: Medicare Other | Admitting: Family Medicine

## 2019-09-11 ENCOUNTER — Other Ambulatory Visit: Payer: Self-pay

## 2019-09-11 ENCOUNTER — Encounter: Payer: Self-pay | Admitting: Family Medicine

## 2019-09-11 VITALS — BP 130/80 | HR 73 | Ht 62.0 in | Wt 134.2 lb

## 2019-09-11 DIAGNOSIS — I2510 Atherosclerotic heart disease of native coronary artery without angina pectoris: Secondary | ICD-10-CM | POA: Diagnosis not present

## 2019-09-11 DIAGNOSIS — E785 Hyperlipidemia, unspecified: Secondary | ICD-10-CM | POA: Diagnosis not present

## 2019-09-11 DIAGNOSIS — I779 Disorder of arteries and arterioles, unspecified: Secondary | ICD-10-CM | POA: Diagnosis not present

## 2019-09-11 DIAGNOSIS — I10 Essential (primary) hypertension: Secondary | ICD-10-CM

## 2019-09-11 NOTE — Progress Notes (Signed)
Cardiology Office Note  Date: 09/11/2019   ID: Adriana Moyer, DOB 1935/08/20, MRN [id number]  PCP:  Rosalee Kaufman, PA-C  Cardiologist:  Kate Sable, MD Electrophysiologist:  None   Chief Complaint: Follow-up CAD (status post non-ST elevation MI), HTN, HLD, GERD, bilateral carotid artery disease,   History of Present Illness: Adriana Moyer is a 84 y.o. female with a history of  CAD (status post non-ST elevation MI), HTN, HLD, GERD, bilateral carotid artery disease.   Seen by Dr. Bronson Ing on July 27, 2018. At that time she was not having any symptoms of chest pain, palpitations, shortness of breath, lightheadedness or dizziness, leg swelling, PND/orthopnea or syncope.  She was experiencing some morning headaches.  She was having some stressful issues with her husband being sick and on chemotherapy.   She is here for follow-up today.  She is having a significant amount of stress recently due to the passing of her husband.  Her blood pressure on arrival was 200/80.  Recheck in left arm was 130/80.  States she has some recent issues with acid reflux and is currently taking Prilosec which seems to be helping.  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  She has not followed up recently due to her husband being sick and Covid restrictions.  Advised her this would be a good idea to follow-up with GI.  She states she is having some occasional precordial stabbing-like chest pains which last only briefly.  Denies any anginal chest pressure or tightness, radiation, nausea, sweating, dyspnea related to activity.  Past Medical History:  Diagnosis Date  . Hypertension   . NSTEMI (non-ST elevated myocardial infarction) [facility])     Past Surgical History:  Procedure Laterality Date  . ABDOMINAL HYSTERECTOMY    . CARDIAC CATHETERIZATION N/A 04/29/2016   Procedure: Left Heart Cath and Coronary Angiography;  Surgeon:  Wellington Hampshire, MD;  Location: [facility];  Service: Cardiovascular;  Laterality: N/A;  . CARDIAC CATHETERIZATION N/A 04/29/2016   Procedure: Coronary Stent Intervention;  Surgeon: Wellington Hampshire, MD;  Location: [facility];  Service: Cardiovascular;  Laterality: N/A;    Current Outpatient Medications  Medication Sig Dispense Refill  . aspirin EC 81 MG EC tablet Take 1 tablet (81 mg total) by mouth daily.    . clotrimazole (LOTRIMIN) 1 % cream Apply 1 application topically daily as needed (for irritation).     . Diphenhydramine-APAP, sleep, (TYLENOL PM EXTRA STRENGTH PO) Take 2 tablets by mouth at bedtime.     Marland Kitchen escitalopram (LEXAPRO) 5 MG tablet Take 5 mg by mouth daily.    Marland Kitchen lisinopril (ZESTRIL) 20 MG tablet Take 1 tablet (20 mg total) by mouth daily. 90 tablet 0  . nitroGLYCERIN (NITROSTAT) 0.4 MG SL tablet Place 1 tablet (0.4 mg total) under the tongue every 5 (five) minutes x 3 doses as needed for chest pain (if no relief after 3rd dose, proceed to the ED for an evaluation or call 911). 25 tablet 3  . pantoprazole (PROTONIX) 40 MG tablet TAKE 1 TABLET BY MOUTH  DAILY 90 tablet 1   No current facility-administered medications for this visit.   Allergies:  Atorvastatin   Social History: The patient  reports that she quit smoking about 53 years ago. Her smoking use included cigarettes. She started smoking about 69 years ago. She has a 10.50 pack-year smoking history. She has  never used smokeless tobacco. She reports that she does not drink alcohol or use drugs.   Family History: The patient's family history includes Heart attack in her father; Heart disease in her mother.   ROS:  Please see the history of present illness. Otherwise, complete review of systems is positive for none.  All other systems are reviewed and negative.   Physical Exam: VS:  BP 130/80   Pulse 73   Ht 5\' 2"  (1.575 m)   Wt 134 lb 3.2 oz (60.9 kg)   SpO2 96%   BMI 24.55 kg/m , BMI Body mass  index is 24.55 kg/m.  Wt Readings from Last 3 Encounters:  09/11/19 134 lb 3.2 oz (60.9 kg)  02/17/19 135 lb (61.2 kg)  10/21/18 137 lb (62.1 kg)    General: Patient appears comfortable at rest. Neck: Supple, no elevated JVP or carotid bruits, no thyromegaly. Lungs: Clear to auscultation, nonlabored breathing at rest. Cardiac: Regular rate and rhythm, no S3 or significant systolic murmur, no pericardial rub. Extremities: No pitting edema, distal pulses 2+. Skin: Warm and dry. Musculoskeletal: No kyphosis. Neuropsychiatric: Alert and oriented x3, affect grossly appropriate.  ECG:  An ECG dated 02/17/2019 was personally reviewed today and demonstrated:  Normal sinus rhythm 70  Recent Labwork: No results found for requested labs within last 8760 hours.     Component Value Date/Time   CHOL 172 04/22/2017 0825   TRIG 111 04/22/2017 0825   HDL 58 04/22/2017 0825   CHOLHDL 3.0 04/22/2017 0825   VLDL 22 04/22/2017 0825   LDLCALC 92 04/22/2017 0825    Other Studies Reviewed Today:   Carotid artery duplex 01/12/2019  Comparison Study: On 08/26/16 , carotid Duplex showed 1-39% ICA stenosis, bilaterally. Right Carotid: Velocities in the right ICA are consistent with a 1-39% stenosis. The ECA appears >50% stenosed. Left Carotid: Velocities in the left ICA are consistent with a 40-59% stenosis. The ECA appears >50% stenosed. Vertebrals: Bilateral vertebral arteries demonstrate antegrade flow. Subclavians: Normal flow hemodynamics were seen in bilateral subclavian arteries  Assessment and Plan:  1. CAD in native artery   2. Essential hypertension   3. Hyperlipidemia LDL goal <70   4. Bilateral carotid artery disease, unspecified type ([place])    1. CAD in native artery Patient denies any progressive anginal or exertional symptoms.  She does describe short stabbing-like pain sensations which do not radiate.  States the pain only lasts a second or 2.  She denies any radiation, dyspnea,  nausea, vomiting, or diaphoresis associated.  She is under significant psychological stress due to the passing of her husband recently.  Continue aspirin 81 mg daily and sublingual nitroglycerin as needed for chest pain.  2. Essential hypertension Blood pressure was significantly elevated on arrival at 200/80.  Recheck in left arm was 130/80.  Continue lisinopril 20 mg daily.  Please start checking her blood pressure every day and if you have sustained blood pressures at or greater than 130/80 please call us and we may need to adjust your lisinopril.  3. Hyperlipidemia LDL goal <70 Last lipid profile on Oct 11, 2017 showed: TC 162, TG 90, HDL 60, VLDL 18, LDL 84.  Patient is currently not taking any statin medications.  4. Bilateral carotid artery disease, unspecified type ([place]) Recent study in August 2020 showed right ICA stenosis of 1 to 39%, left ICA a stenosis 40 to 59%.  Repeat carotid artery duplex study in August 2021  Medication Adjustments/Labs and Tests Ordered: Current medicines are  reviewed at length with the patient today.  Concerns regarding medicines are outlined above.   Disposition: Follow-up with Dr. Bronson Ing or APP 6 months  Signed, Levell July, NP 09/11/2019 3:00 PM    [facility] [place], [place], [place] [address] Phone: [phone]; Fax: [phone]

## 2019-09-11 NOTE — Patient Instructions (Addendum)
Medication Instructions:   Your physician recommends that you continue on your current medications as directed. Please refer to the Current Medication list given to you today.  Labwork:  NONE  Testing/Procedures: Your physician has requested that you have a carotid duplex in August 2021. This test is an ultrasound of the carotid arteries in your neck. It looks at blood flow through these arteries that supply the brain with blood. Allow one hour for this exam. There are no restrictions or special instructions.  Follow-Up:  Your physician recommends that you schedule a follow-up appointment in: 6 months (office). You will receive a reminder letter in the mail in about 4 months reminding you to call and schedule your appointment. If you don't receive this letter, please contact our office.  Any Other Special Instructions Will Be Listed Below (If Applicable).  If you need a refill on your cardiac medications before your next appointment, please call your pharmacy.

## 2019-09-11 NOTE — Addendum Note (Signed)
Addended by: Merlene Laughter on: 09/11/2019 03:06 PM   Modules accepted: Orders

## 2019-09-27 ENCOUNTER — Other Ambulatory Visit: Payer: Self-pay | Admitting: Cardiovascular Disease

## 2019-10-03 DIAGNOSIS — L247 Irritant contact dermatitis due to plants, except food: Secondary | ICD-10-CM | POA: Diagnosis not present

## 2019-10-16 ENCOUNTER — Other Ambulatory Visit: Payer: Self-pay | Admitting: Cardiovascular Disease

## 2019-10-19 ENCOUNTER — Other Ambulatory Visit: Payer: Self-pay | Admitting: Cardiovascular Disease

## 2019-11-01 ENCOUNTER — Other Ambulatory Visit: Payer: Self-pay | Admitting: Cardiovascular Disease

## 2019-11-22 DIAGNOSIS — B372 Candidiasis of skin and nail: Secondary | ICD-10-CM | POA: Diagnosis not present

## 2019-11-22 DIAGNOSIS — Z6824 Body mass index (BMI) 24.0-24.9, adult: Secondary | ICD-10-CM | POA: Diagnosis not present

## 2020-02-14 DIAGNOSIS — H2513 Age-related nuclear cataract, bilateral: Secondary | ICD-10-CM | POA: Diagnosis not present

## 2020-02-14 DIAGNOSIS — H25013 Cortical age-related cataract, bilateral: Secondary | ICD-10-CM | POA: Diagnosis not present

## 2020-02-14 DIAGNOSIS — H524 Presbyopia: Secondary | ICD-10-CM | POA: Diagnosis not present

## 2020-02-28 DIAGNOSIS — K921 Melena: Secondary | ICD-10-CM | POA: Diagnosis not present

## 2020-02-28 DIAGNOSIS — D510 Vitamin B12 deficiency anemia due to intrinsic factor deficiency: Secondary | ICD-10-CM | POA: Diagnosis not present

## 2020-02-28 DIAGNOSIS — D638 Anemia in other chronic diseases classified elsewhere: Secondary | ICD-10-CM | POA: Diagnosis not present

## 2020-02-28 DIAGNOSIS — R197 Diarrhea, unspecified: Secondary | ICD-10-CM | POA: Diagnosis not present

## 2020-02-28 DIAGNOSIS — D508 Other iron deficiency anemias: Secondary | ICD-10-CM | POA: Diagnosis not present

## 2020-02-28 DIAGNOSIS — M25473 Effusion, unspecified ankle: Secondary | ICD-10-CM | POA: Diagnosis not present

## 2020-03-04 DIAGNOSIS — L218 Other seborrheic dermatitis: Secondary | ICD-10-CM | POA: Diagnosis not present

## 2020-03-04 DIAGNOSIS — D485 Neoplasm of uncertain behavior of skin: Secondary | ICD-10-CM | POA: Diagnosis not present

## 2020-03-04 DIAGNOSIS — L304 Erythema intertrigo: Secondary | ICD-10-CM | POA: Diagnosis not present

## 2020-03-12 ENCOUNTER — Ambulatory Visit: Payer: Medicare Other | Admitting: Cardiovascular Disease

## 2020-03-13 ENCOUNTER — Ambulatory Visit (INDEPENDENT_AMBULATORY_CARE_PROVIDER_SITE_OTHER): Payer: Medicare Other

## 2020-03-13 ENCOUNTER — Other Ambulatory Visit: Payer: Self-pay | Admitting: Family Medicine

## 2020-03-13 DIAGNOSIS — I6523 Occlusion and stenosis of bilateral carotid arteries: Secondary | ICD-10-CM | POA: Diagnosis not present

## 2020-03-13 DIAGNOSIS — I779 Disorder of arteries and arterioles, unspecified: Secondary | ICD-10-CM | POA: Diagnosis not present

## 2020-03-17 NOTE — Progress Notes (Signed)
Cardiology Office Note  Date: 03/18/2020   ID: Adriana Moyer, DOB Apr 28, 1936, MRN [id number]  PCP:  Rosalee Kaufman, PA-C  Cardiologist:  No primary care provider on file. Electrophysiologist:  None   Chief Complaint: Follow-up CAD (status post non-ST elevation MI), HTN, HLD, GERD, bilateral carotid artery disease,   History of Present Illness: Adriana Moyer is a 84 y.o. female with a history of  CAD (status post non-ST elevation MI), HTN, HLD, GERD, bilateral carotid artery disease.   At last visit she was having a significant amount of stress recently due to the passing of her husband.  Her blood pressure on arrival was 200/80.  Recheck in left arm was 130/80.  Stated she had some recent issues with acid reflux and was currently taking Prilosec which seemed to be helping.  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  She has not followed up recently due to her husband being sick and Covid restrictions.  Advised her this would be a good idea to follow-up with GI.  She stated she was having some occasional precordial stabbing-like chest pains which lasted only briefly.  Denied any anginal chest pressure or tightness, radiation, nausea, sweating, dyspnea related to activity.   She is here for 74-month follow-up.  Recently had carotid artery repeat study which showed continued 40 to 59% left carotid artery stenosis with 1 to 39% right carotid stenosis.  Denies any anginal or exertional issues.  States she is having some issues with bad acid reflux and some issues with diarrhea.  She states her primary care provider was going to refer her to an internist.  She denies any recent changes in her diet or medications.  Denies any recent anginal or exertional symptoms.  States the acid reflux is not associated with exertion.  She takes Protonix for GERD.  States sometimes she takes an extra pill if needed.  She continues to be  under a fair amount of emotional stress due to the loss of her husband.  She states when she is busy doing something she has less issues.  She has been painting the inside of her house and has recently finished.  Denies any anginal or exertional symptoms when performing this activity.  Denies any palpitations or arrhythmias, orthostatic symptoms, CVA or TIA-like symptoms, bleeding issues, claudication-like symptoms, DVT or PE-like symptoms, or lower extremity edema.  Past Medical History:  Diagnosis Date  . Hypertension   . NSTEMI (non-ST elevated myocardial infarction) [facility])     Past Surgical History:  Procedure Laterality Date  . ABDOMINAL HYSTERECTOMY    . CARDIAC CATHETERIZATION N/A 04/29/2016   Procedure: Left Heart Cath and Coronary Angiography;  Surgeon: Wellington Hampshire, MD;  Location: [place] [facility];  Service: Cardiovascular;  Laterality: N/A;  . CARDIAC CATHETERIZATION N/A 04/29/2016   Procedure: Coronary Stent Intervention;  Surgeon: Wellington Hampshire, MD;  Location: [place] [facility];  Service: Cardiovascular;  Laterality: N/A;    Current Outpatient Medications  Medication Sig Dispense Refill  . aspirin EC 81 MG EC tablet Take 1 tablet (81 mg total) by mouth daily.    . clotrimazole (LOTRIMIN) 1 % cream Apply 1 application topically daily as needed (for irritation).     . Diphenhydramine-APAP, sleep, (TYLENOL PM EXTRA STRENGTH PO) Take 2 tablets by mouth at bedtime.     Marland Kitchen escitalopram (LEXAPRO) 5 MG tablet Take 5 mg by  mouth daily.    Marland Kitchen lisinopril (ZESTRIL) 20 MG tablet TAKE 1 TABLET BY MOUTH  DAILY 90 tablet 1  . nitroGLYCERIN (NITROSTAT) 0.4 MG SL tablet Place 1 tablet (0.4 mg total) under the tongue every 5 (five) minutes x 3 doses as needed for chest pain (if no relief after 3rd dose, proceed to the ED for an evaluation or call 911). 25 tablet 3  . pantoprazole (PROTONIX) 40 MG tablet TAKE 1 TABLET BY MOUTH  DAILY 90 tablet 1   No current facility-administered medications  for this visit.   Allergies:  Atorvastatin   Social History: The patient  reports that she quit smoking about 53 years ago. Her smoking use included cigarettes. She started smoking about 69 years ago. She has a 10.50 pack-year smoking history. She has never used smokeless tobacco. She reports that she does not drink alcohol and does not use drugs.   Family History: The patient's family history includes Heart attack in her father; Heart disease in her mother.   ROS:  Please see the history of present illness. Otherwise, complete review of systems is positive for none.  All other systems are reviewed and negative.   Physical Exam: VS:  BP (!) 142/78   Pulse 75   Ht 5\' 2"  (1.575 m)   Wt 133 lb 6.4 oz (60.5 kg)   SpO2 98%   BMI 24.40 kg/m , BMI Body mass index is 24.4 kg/m.  Wt Readings from Last 3 Encounters:  03/18/20 133 lb 6.4 oz (60.5 kg)  09/11/19 134 lb 3.2 oz (60.9 kg)  02/17/19 135 lb (61.2 kg)    General: Patient appears comfortable at rest. Neck: Supple, no elevated JVP or carotid bruits, no thyromegaly. Lungs: Clear to auscultation, nonlabored breathing at rest. Cardiac: Regular rate and rhythm, no S3 or significant systolic murmur, no pericardial rub. Extremities: No pitting edema, distal pulses 2+. Skin: Warm and dry. Musculoskeletal: No kyphosis. Neuropsychiatric: Alert and oriented x3, affect grossly appropriate.  ECG:  An ECG dated 03/18/2020 was personally reviewed today and demonstrated:  Sinus rhythm with PACs rate of 85.  Recent Labwork: No results found for requested labs within last 8760 hours.     Component Value Date/Time   CHOL 172 04/22/2017 0825   TRIG 111 04/22/2017 0825   HDL 58 04/22/2017 0825   CHOLHDL 3.0 04/22/2017 0825   VLDL 22 04/22/2017 0825   LDLCALC 92 04/22/2017 0825    Other Studies Reviewed Today:  Carotid Artery Duplex 03/13/2020 Right Carotid: Velocities in the right ICA are consistent with a 1-39% stenosis. The ECA  appears >50% stenosed. Left Carotid: Velocities in the left ICA are consistent with a 40-59% stenosis. The ECA appears >50% stenosed. Vertebrals: Bilateral vertebral arteries demonstrate antegrade flow. Subclavians: Normal flow hemodynamics were seen in bilateral subclavian arteries. Final    Carotid artery duplex 01/12/2019  Comparison Study: On 08/26/16 , carotid Duplex showed 1-39% ICA stenosis, bilaterally. Right Carotid: Velocities in the right ICA are consistent with a 1-39% stenosis. The ECA appears >50% stenosed. Left Carotid: Velocities in the left ICA are consistent with a 40-59% stenosis. The ECA appears >50% stenosed. Vertebrals: Bilateral vertebral arteries demonstrate antegrade flow. Subclavians: Normal flow hemodynamics were seen in bilateral subclavian arteries  Assessment and Plan:   1. CAD in native artery Patient deniesany progressive anginal or exertional symptoms.  Continues under significant psychological stress due to the passing of her husband. Continue aspirin 81 mg daily and sublingual nitroglycerin as needed for chest  pain.  2. Essential hypertension Blood pressure today 142/78.  Continue lisinopril 20 mg daily.  Please start checking  blood pressure every day and if you have sustained blood pressures at or greater than 130/80 please call us and we may need to adjust your lisinopril.  Patient states she will purchase a blood pressure cuff today.  Stated she does did not check her blood pressure at home due to not having a cuff  3. Hyperlipidemia LDL goal <70 Last lipid profile on Oct 11, 2017 showed: TC 162, TG 90, HDL 60, VLDL 18, LDL 84.  Patient is currently not taking any statin medications.  4. Bilateral carotid artery disease, unspecified type ([place]) Recent carotid duplex 03/14/2019: RICA 1 to 39% stenosis.  ECA.   greater than 50% stenosis.  LICA consistent with 40 to 59% stenosis.  ECA greater than 50% stenosis.  Vertebral arteries bilaterally demonstrated  antegrade flow.  Normal flow dynamics seen in bilateral subclavian arteries.  Medication Adjustments/Labs and Tests Ordered: Current medicines are reviewed at length with the patient today.  Concerns regarding medicines are outlined above.   Disposition: Follow-up with Dr. Domenic Polite or APP 6 months.  Signed, Levell July, NP 03/18/2020 11:10 AM    [facility] [place], [place], [place] [address] Phone: [phone]; Fax: [phone]

## 2020-03-18 ENCOUNTER — Ambulatory Visit: Payer: Medicare Other | Admitting: Family Medicine

## 2020-03-18 ENCOUNTER — Telehealth: Payer: Self-pay | Admitting: *Deleted

## 2020-03-18 ENCOUNTER — Encounter: Payer: Self-pay | Admitting: Family Medicine

## 2020-03-18 VITALS — BP 142/78 | HR 75 | Ht 62.0 in | Wt 133.4 lb

## 2020-03-18 DIAGNOSIS — I10 Essential (primary) hypertension: Secondary | ICD-10-CM | POA: Diagnosis not present

## 2020-03-18 NOTE — Telephone Encounter (Signed)
Laurine Blazer, LPN  51/02/2110 73:56 AM EDT Back to Top    Katina Dung, NP notified at [facility] today. Copy to pcp.    Laurine Blazer, LPN  70/14/1030 1:31 PM EDT     Left message to return call.   Verta Ellen., NP  03/13/2020 11:11 PM EDT     Please call the patient and let her know the carotid artery study shows right carotid artery has mild narrowing 1-39% and left carotid artery shows medium 40-59% narrowing. This is unchanged from previous study last year. Thank You

## 2020-03-18 NOTE — Patient Instructions (Signed)
Medication Instructions:  Continue all current medications.   Labwork: none  Testing/Procedures: none  Follow-Up: 6 months   Any Other Special Instructions Will Be Listed Below (If Applicable).   If you need a refill on your cardiac medications before your next appointment, please call your pharmacy.  

## 2020-03-26 DIAGNOSIS — H2511 Age-related nuclear cataract, right eye: Secondary | ICD-10-CM | POA: Diagnosis not present

## 2020-03-26 DIAGNOSIS — H25011 Cortical age-related cataract, right eye: Secondary | ICD-10-CM | POA: Diagnosis not present

## 2020-03-26 DIAGNOSIS — H25811 Combined forms of age-related cataract, right eye: Secondary | ICD-10-CM | POA: Diagnosis not present

## 2020-03-28 ENCOUNTER — Encounter: Payer: Self-pay | Admitting: Internal Medicine

## 2020-04-01 DIAGNOSIS — M25473 Effusion, unspecified ankle: Secondary | ICD-10-CM | POA: Diagnosis not present

## 2020-04-01 DIAGNOSIS — Z23 Encounter for immunization: Secondary | ICD-10-CM | POA: Diagnosis not present

## 2020-04-01 DIAGNOSIS — K921 Melena: Secondary | ICD-10-CM | POA: Diagnosis not present

## 2020-04-01 DIAGNOSIS — R197 Diarrhea, unspecified: Secondary | ICD-10-CM | POA: Diagnosis not present

## 2020-04-16 DIAGNOSIS — H0011 Chalazion right upper eyelid: Secondary | ICD-10-CM | POA: Diagnosis not present

## 2020-04-29 ENCOUNTER — Encounter: Payer: Self-pay | Admitting: Internal Medicine

## 2020-04-29 ENCOUNTER — Ambulatory Visit: Payer: Medicare Other | Admitting: Internal Medicine

## 2020-05-01 ENCOUNTER — Other Ambulatory Visit: Payer: Self-pay | Admitting: *Deleted

## 2020-05-01 MED ORDER — LISINOPRIL 20 MG PO TABS: 20.0000 mg | ORAL_TABLET | Freq: Every day | ORAL | 1 refills | Status: AC

## 2020-05-02 ENCOUNTER — Ambulatory Visit: Payer: Medicare Other | Admitting: Internal Medicine

## 2020-05-29 ENCOUNTER — Other Ambulatory Visit: Payer: Self-pay

## 2020-05-29 MED ORDER — PANTOPRAZOLE SODIUM 40 MG PO TBEC: 40.0000 mg | DELAYED_RELEASE_TABLET | Freq: Every day | ORAL | 1 refills | Status: DC

## 2020-05-29 NOTE — Telephone Encounter (Signed)
Refilled protonix

## 2020-06-20 ENCOUNTER — Telehealth: Payer: Self-pay | Admitting: *Deleted

## 2020-06-20 ENCOUNTER — Encounter: Payer: Self-pay | Admitting: Internal Medicine

## 2020-06-20 ENCOUNTER — Telehealth: Payer: Self-pay | Admitting: Internal Medicine

## 2020-06-20 ENCOUNTER — Other Ambulatory Visit: Payer: Self-pay

## 2020-06-20 ENCOUNTER — Ambulatory Visit: Payer: Medicare Other | Admitting: Internal Medicine

## 2020-06-20 VITALS — BP 182/88 | HR 79 | Temp 96.8°F | Ht 62.0 in | Wt 137.0 lb

## 2020-06-20 DIAGNOSIS — D509 Iron deficiency anemia, unspecified: Secondary | ICD-10-CM

## 2020-06-20 DIAGNOSIS — R1319 Other dysphagia: Secondary | ICD-10-CM

## 2020-06-20 DIAGNOSIS — R197 Diarrhea, unspecified: Secondary | ICD-10-CM

## 2020-06-20 MED ORDER — PANTOPRAZOLE SODIUM 40 MG PO TBEC: 40.0000 mg | DELAYED_RELEASE_TABLET | Freq: Two times a day (BID) | ORAL | 1 refills | Status: AC

## 2020-06-20 NOTE — Telephone Encounter (Signed)
Pt was returning a call. [phone]

## 2020-06-20 NOTE — Telephone Encounter (Signed)
See prior note

## 2020-06-20 NOTE — Patient Instructions (Signed)
We will schedule you for upper endoscopy to further evaluate your trouble swallowing.  We may perform dilation at that time.  I want you to take Protonix 40 mg twice daily.  Discontinue your omeprazole.  Lifestyle and home remedies TO MANAGE REFLUX/HEARTBURN    You may eliminate or reduce the frequency of heartburn by making the following lifestyle changes:    Control your weight. Being overweight is a major risk factor for heartburn and GERD. Excess pounds put pressure on your abdomen, pushing up your stomach and causing acid to back up into your esophagus.     Eat smaller meals. 4 TO 6 MEALS A DAY. This reduces pressure on the lower esophageal sphincter, helping to prevent the valve from opening and acid from washing back into your esophagus.      Loosen your belt. Clothes that fit tightly around your waist put pressure on your abdomen and the lower esophageal sphincter.      Eliminate heartburn triggers. Everyone has specific triggers. Common triggers such as fatty or fried foods, spicy food, tomato sauce, carbonated beverages, alcohol, chocolate, mint, garlic, onion, caffeine and nicotine may make heartburn worse.     Avoid stooping or bending. Tying your shoes is OK. Bending over for longer periods to weed your garden isn't, especially soon after eating.     Don't lie down after a meal. Wait at least three to four hours after eating before going to bed, and don't lie down right after eating.   At [facility] we value your feedback. You may receive a survey about your visit today. Please share your experience as we strive to create trusting relationships with our patients to provide genuine, compassionate, quality care.  We appreciate your understanding and patience as we review any laboratory studies, imaging, and other diagnostic tests that are ordered as we care for you. Our office policy is 5 business days for review of these results, and any emergent or urgent  results are addressed in a timely manner for your best interest. If you do not hear from our office in 1 week, please contact us.   We also encourage the use of MyChart, which contains your medical information for your review as well. If you are not enrolled in this feature, an access code is on this after visit summary for your convenience. Thank you for allowing [place] to be involved in your care.  It was great to see you today!  I hope you have a great rest of your winter!!    Adriana Moyer. Adriana Moyer, D.O. Gastroenterology and Hepatology [facility]

## 2020-06-20 NOTE — Progress Notes (Signed)
Primary Care Physician:  Rosine Door Primary Gastroenterologist:  Dr. Abbey Chatters  Chief Complaint  Patient presents with  . Anemia  . Diarrhea  . Gastroesophageal Reflux    HPI:   Adriana Moyer is a 85 y.o. female who presents to the clinic today as a new patient by referral from her PCP Clemmie Krill.  Patient has multiple GI complaints for me today.  Her main complaint is dysphagia.  States she has issues with solids "getting stuck" in her substernal region.  Does note occasional regurgitation as well.  Does not have symptoms with liquids for the most part.  States she had an EGD years ago and was told that she has dysplasia.  States this was around 8 years ago.  Also notes diarrhea which is intermittent in nature.  This occurs a few times a week.  She states this is related to her nerves as her husband of 63 years died from cancer less than a year ago.  She is actually tearful on interview and exam with me today.  States her last colonoscopy was 8 years ago as well and she had 2 polyps removed.  According to the referral she is also anemic.  States she had blood work done recently though I do not have access to this.  The last hemoglobin that I can find is from 09/17/2017 which is 11.3.  Patient denies any melena hematochezia.  Does states she was started on iron recently but stopped this because it caused her stomach to be upset as well as turned her stools dark.  States she had a stool test which was heme positive.  Past Medical History:  Diagnosis Date  . Hypertension   . NSTEMI (non-ST elevated myocardial infarction) [facility])     Past Surgical History:  Procedure Laterality Date  . ABDOMINAL HYSTERECTOMY    . CARDIAC CATHETERIZATION N/A 04/29/2016   Procedure: Left Heart Cath and Coronary Angiography;  Surgeon: Wellington Hampshire, MD;  Location: [place] [facility];  Service: Cardiovascular;  Laterality: N/A;  . CARDIAC CATHETERIZATION N/A 04/29/2016   Procedure:  Coronary Stent Intervention;  Surgeon: Wellington Hampshire, MD;  Location: [place] [facility];  Service: Cardiovascular;  Laterality: N/A;    Current Outpatient Medications  Medication Sig Dispense Refill  . aspirin EC 81 MG EC tablet Take 1 tablet (81 mg total) by mouth daily.    . clotrimazole (LOTRIMIN) 1 % cream Apply 1 application topically daily as needed (for irritation).     . Diphenhydramine-APAP, sleep, (TYLENOL PM EXTRA STRENGTH PO) Take 2 tablets by mouth at bedtime.     Marland Kitchen escitalopram (LEXAPRO) 5 MG tablet Take 5 mg by mouth daily.    Marland Kitchen lisinopril (ZESTRIL) 20 MG tablet Take 1 tablet (20 mg total) by mouth daily. 90 tablet 1  . nitroGLYCERIN (NITROSTAT) 0.4 MG SL tablet Place 1 tablet (0.4 mg total) under the tongue every 5 (five) minutes x 3 doses as needed for chest pain (if no relief after 3rd dose, proceed to the ED for an evaluation or call 911). 25 tablet 3  . omeprazole (PRILOSEC) 20 MG capsule Take 20 mg by mouth as needed.    . pantoprazole (PROTONIX) 40 MG tablet Take 1 tablet (40 mg total) by mouth daily. 90 tablet 1   No current facility-administered medications for this visit.    Allergies as of 06/20/2020 - Review Complete 06/20/2020  Allergen Reaction Noted  . Atorvastatin  02/17/2019  Family History  Problem Relation Age of Onset  . Heart disease Mother   . Heart attack Father     Social History   Socioeconomic History  . Marital status: Married    Spouse name: Not on file  . Number of children: Not on file  . Years of education: Not on file  . Highest education level: Not on file  Occupational History  . Not on file  Tobacco Use  . Smoking status: Former Smoker    Packs/day: 0.75    Years: 14.00    Pack years: 10.50    Types: Cigarettes    Start date: 09/17/1950    Quit date: 05/25/1966    Years since quitting: 54.1  . Smokeless tobacco: Never Used  Substance and Sexual Activity  . Alcohol use: No  . Drug use: No  . Sexual activity: Not on  file  Other Topics Concern  . Not on file  Social History Narrative  . Not on file   Social Determinants of Health   Financial Resource Strain: Not on file  Food Insecurity: Not on file  Transportation Needs: Not on file  Physical Activity: Not on file  Stress: Not on file  Social Connections: Not on file  Intimate Partner Violence: Not on file    Subjective: Review of Systems  Constitutional: Negative for chills and fever.  HENT: Negative for congestion and hearing loss.   Eyes: Negative for blurred vision and double vision.  Respiratory: Negative for cough and shortness of breath.   Cardiovascular: Negative for chest pain and palpitations.  Gastrointestinal: Positive for blood in stool, diarrhea and heartburn. Negative for abdominal pain, constipation, melena and vomiting.  Genitourinary: Negative for dysuria and urgency.  Musculoskeletal: Negative for joint pain and myalgias.  Skin: Negative for itching and rash.  Neurological: Negative for dizziness and headaches.  Psychiatric/Behavioral: Negative for depression. The patient is not nervous/anxious.        Objective: BP (!) 182/88 (BP Location: Right Arm, Cuff Size: Normal)   Pulse 79   Temp (!) 96.8 F (36 C) (Temporal)   Ht 5\' 2"  (1.575 m)   Wt 137 lb (62.1 kg)   BMI 25.06 kg/m  Physical Exam Constitutional:      Appearance: Normal appearance.  HENT:     Head: Normocephalic and atraumatic.  Eyes:     Extraocular Movements: Extraocular movements intact.     Conjunctiva/sclera: Conjunctivae normal.  Cardiovascular:     Rate and Rhythm: Normal rate and regular rhythm.  Pulmonary:     Effort: Pulmonary effort is normal.     Breath sounds: Normal breath sounds.  Abdominal:     General: Bowel sounds are normal.     Palpations: Abdomen is soft.  Musculoskeletal:        General: No swelling. Normal range of motion.     Cervical back: Normal range of motion and neck supple.  Skin:    General: Skin is warm  and dry.     Coloration: Skin is not jaundiced.  Neurological:     General: No focal deficit present.     Mental Status: She is alert and oriented to person, place, and time.  Psychiatric:        Mood and Affect: Mood normal.        Behavior: Behavior normal.      Assessment: *Dysphagia-new worsening *Iron deficiency anemia *Heme positive stool *Diarrhea  Plan: Regards to patient's new/worsening dysphagia, Will schedule for EGD to evaluate for  peptic ulcer disease, esophagitis, gastritis, H. Pylori, duodenitis, or other. Will also evaluate for esophageal stricture, Schatzki's ring, esophageal web or other.   The risks including infection, bleed, or perforation as well as benefits, limitations, alternatives and imponderables have been reviewed with the patient. Potential for esophageal dilation, biopsy, etc. have also been reviewed.  Questions have been answered. All parties agreeable.  I will request patient's most recent blood tests.  Did discuss anemia in depth with her today.  She is not interested in undergoing colonoscopy at this time.  I discussed that we could be missing colon polyps and/or cancer and she understands.  Patient instructed to take Imodium as needed for diarrhea that is intermittent in nature.  Patient to follow-up after procedure.   06/20/2020 10:29 AM   Disclaimer: This note was dictated with voice recognition software. Similar sounding words can inadvertently be transcribed and may not be corrected upon review.

## 2020-06-20 NOTE — Telephone Encounter (Signed)
Patient returned call. She has been scheduled for 2/15 at 9:!5am. Aware will need pre-op/covid test prior. Advised will mail with prep instructions.   PA approved via [facility] website. Auth# [id number] DOS Jul 09, 2020 - Oct 07, 2020

## 2020-06-20 NOTE — Telephone Encounter (Signed)
Called pt, LMOVM to call back to schedule EGD/DIL with Dr. Abbey Chatters, ASA 3

## 2020-06-21 ENCOUNTER — Telehealth: Payer: Self-pay | Admitting: Internal Medicine

## 2020-06-21 ENCOUNTER — Encounter: Payer: Self-pay | Admitting: *Deleted

## 2020-06-21 NOTE — Telephone Encounter (Signed)
Patient is asking to speak with Mindy regarding her insurance and how much it will pay so she can plan around her bank account. Please call [phone]

## 2020-06-21 NOTE — Telephone Encounter (Signed)
Called pt, VM full and unable to leave VM

## 2020-06-24 DIAGNOSIS — L570 Actinic keratosis: Secondary | ICD-10-CM | POA: Diagnosis not present

## 2020-06-24 DIAGNOSIS — X32XXXD Exposure to sunlight, subsequent encounter: Secondary | ICD-10-CM | POA: Diagnosis not present

## 2020-06-24 DIAGNOSIS — L820 Inflamed seborrheic keratosis: Secondary | ICD-10-CM | POA: Diagnosis not present

## 2020-06-24 DIAGNOSIS — B9689 Other specified bacterial agents as the cause of diseases classified elsewhere: Secondary | ICD-10-CM | POA: Diagnosis not present

## 2020-06-24 DIAGNOSIS — L218 Other seborrheic dermatitis: Secondary | ICD-10-CM | POA: Diagnosis not present

## 2020-06-24 DIAGNOSIS — L0202 Furuncle of face: Secondary | ICD-10-CM | POA: Diagnosis not present

## 2020-07-02 NOTE — Patient Instructions (Signed)
Adriana Moyer  07/02/2020     @PREFPERIOPPHARMACY @   Your procedure is scheduled on  07/09/2020.   Report to [facility] at  [email or web address].   Call this number if you have problems the morning of surgery:  [phone]   Remember:  Follow the diet instructions given to you by the office.                      Take these medicines the morning of surgery with A SIP OF WATER    Lexapro, protonix.    Please brush your teeth.  Do not wear jewelry, make-up or nail polish.  Do not wear lotions, powders, or perfumes, or deodorant.  Do not shave 48 hours prior to surgery.  Men may shave face and neck.  Do not bring valuables to the hospital.  [facility] is not responsible for any belongings or valuables.  Contacts, dentures or bridgework may not be worn into surgery.  Leave your suitcase in the car.  After surgery it may be brought to your room.  For patients admitted to the hospital, discharge time will be determined by your treatment team.  Patients discharged the day of surgery will not be allowed to drive home and must have someone with them for 24 hours.     Special instructions:   DO NOT smoke tobacco or vape the morning of your procedure.   Please read over the following fact sheets that you were given. Anesthesia Post-op Instructions and Care and Recovery After Surgery       Upper Endoscopy, Adult, Care After This sheet gives you information about how to care for yourself after your procedure. Your health care provider may also give you more specific instructions. If you have problems or questions, contact your health care provider. What can I expect after the procedure? After the procedure, it is common to have:  A sore throat.  Mild stomach pain or discomfort.  Bloating.  Nausea. Follow these instructions at home:  Follow instructions from your health care provider about what to eat or drink after your procedure.  Return to your normal  activities as told by your health care provider. Ask your health care provider what activities are safe for you.  Take over-the-counter and prescription medicines only as told by your health care provider.  If you were given a sedative during the procedure, it can affect you for several hours. Do not drive or operate machinery until your health care provider says that it is safe.  Keep all follow-up visits as told by your health care provider. This is important.   Contact a health care provider if you have:  A sore throat that lasts longer than one day.  Trouble swallowing. Get help right away if:  You vomit blood or your vomit looks like coffee grounds.  You have: ? A fever. ? Bloody, black, or tarry stools. ? A severe sore throat or you cannot swallow. ? Difficulty breathing. ? Severe pain in your chest or abdomen. Summary  After the procedure, it is common to have a sore throat, mild stomach discomfort, bloating, and nausea.  If you were given a sedative during the procedure, it can affect you for several hours. Do not drive or operate machinery until your health care provider says that it is safe.  Follow instructions from your health care provider about what to eat or drink after your procedure.  Return to your normal activities as told by your health care provider. This information is not intended to replace advice given to you by your health care provider. Make sure you discuss any questions you have with your health care provider. Document Revised: 05/09/2019 Document Reviewed: 10/11/2017 Elsevier Patient Education  2021 [employer].  https://www.asge.org/home/for-patients/patient-information/understanding-eso-dilation-updated">  Esophageal Dilatation Esophageal dilatation, also called esophageal dilation, is a procedure to widen or open a blocked or narrowed part of the esophagus. The esophagus is the part of the body that moves food and liquid from the mouth to the  stomach. You may need this procedure if:  You have a buildup of scar tissue in your esophagus that makes it difficult, painful, or impossible to swallow. This can be caused by gastroesophageal reflux disease (GERD).  You have cancer of the esophagus.  There is a problem with how food moves through your esophagus. In some cases, you may need this procedure repeated at a later time to dilate the esophagus gradually. Tell a health care provider about:  Any allergies you have.  All medicines you are taking, including vitamins, herbs, eye drops, creams, and over-the-counter medicines.  Any problems you or family members have had with anesthetic medicines.  Any blood disorders you have.  Any surgeries you have had.  Any medical conditions you have.  Any antibiotic medicines you are required to take before dental procedures.  Whether you are pregnant or may be pregnant. What are the risks? Generally, this is a safe procedure. However, problems may occur, including:  Bleeding due to a tear in the lining of the esophagus.  A hole, or perforation, in the esophagus. What happens before the procedure?  Ask your health care provider about: ? Changing or stopping your regular medicines. This is especially important if you are taking diabetes medicines or blood thinners. ? Taking medicines such as aspirin and ibuprofen. These medicines can thin your blood. Do not take these medicines unless your health care provider tells you to take them. ? Taking over-the-counter medicines, vitamins, herbs, and supplements.  Follow instructions from your health care provider about eating or drinking restrictions.  Plan to have a responsible adult take you home from the hospital or clinic.  Plan to have a responsible adult care for you for the time you are told after you leave the hospital or clinic. This is important. What happens during the procedure?  You may be given a medicine to help you relax  (sedative).  A numbing medicine may be sprayed into the back of your throat, or you may gargle the medicine.  Your health care provider may perform the dilatation using various surgical instruments, such as: ? Simple dilators. This instrument is carefully placed in the esophagus to stretch it. ? Guided wire bougies. This involves using an endoscope to insert a wire into the esophagus. A dilator is passed over this wire to enlarge the esophagus. Then the wire is removed. ? Balloon dilators. An endoscope with a small balloon is inserted into the esophagus. The balloon is inflated to stretch the esophagus and open it up. The procedure may vary among health care providers and hospitals. What can I expect after the procedure?  Your blood pressure, heart rate, breathing rate, and blood oxygen level will be monitored until you leave the hospital or clinic.  Your throat may feel slightly sore and numb. This will get better over time.  You will not be allowed to eat or drink until your throat is no  longer numb.  When you are able to drink, urinate, and sit on the edge of the bed without nausea or dizziness, you may be able to return home. Follow these instructions at home:  Take over-the-counter and prescription medicines only as told by your health care provider.  If you were given a sedative during the procedure, it can affect you for several hours. Do not drive or operate machinery until your health care provider says that it is safe.  Plan to have a responsible adult care for you for the time you are told. This is important.  Follow instructions from your health care provider about any eating or drinking restrictions.  Do not use any products that contain nicotine or tobacco, such as cigarettes, e-cigarettes, and chewing tobacco. If you need help quitting, ask your health care provider.  Keep all follow-up visits. This is important. Contact a health care provider if:  You have a  fever.  You have pain that is not relieved by medicine. Get help right away if:  You have chest pain.  You have trouble breathing.  You have trouble swallowing.  You vomit blood.  You have black, tarry, or bloody stools. These symptoms may represent a serious problem that is an emergency. Do not wait to see if the symptoms will go away. Get medical help right away. Call your local emergency services (911 in the [place] Do not drive yourself to the hospital. Summary  Esophageal dilatation, also called esophageal dilation, is a procedure to widen or open a blocked or narrowed part of the esophagus.  Plan to have a responsible adult take you home from the hospital or clinic.  For this procedure, a numbing medicine may be sprayed into the back of your throat, or you may gargle the medicine.  Do not drive or operate machinery until your health care provider says that it is safe. This information is not intended to replace advice given to you by your health care provider. Make sure you discuss any questions you have with your health care provider. Document Revised: 09/27/2019 Document Reviewed: 09/27/2019 Elsevier Patient Education  2021 [place] After This sheet gives you information about how to care for yourself after your procedure. Your health care provider may also give you more specific instructions. If you have problems or questions, contact your health care provider. What can I expect after the procedure? After the procedure, it is common to have:  Tiredness.  Forgetfulness about what happened after the procedure.  Impaired judgment for important decisions.  Nausea or vomiting.  Some difficulty with balance. Follow these instructions at home: For the time period you were told by your health care provider:  Rest as needed.  Do not participate in activities where you could fall or become injured.  Do not drive or use  machinery.  Do not drink alcohol.  Do not take sleeping pills or medicines that cause drowsiness.  Do not make important decisions or sign legal documents.  Do not take care of children on your own.      Eating and drinking  Follow the diet that is recommended by your health care provider.  Drink enough fluid to keep your urine pale yellow.  If you vomit: ? Drink water, juice, or soup when you can drink without vomiting. ? Make sure you have little or no nausea before eating solid foods. General instructions  Have a responsible adult stay with you for the time you are told.  It is important to have someone help care for you until you are awake and alert.  Take over-the-counter and prescription medicines only as told by your health care provider.  If you have sleep apnea, surgery and certain medicines can increase your risk for breathing problems. Follow instructions from your health care provider about wearing your sleep device: ? Anytime you are sleeping, including during daytime naps. ? While taking prescription pain medicines, sleeping medicines, or medicines that make you drowsy.  Avoid smoking.  Keep all follow-up visits as told by your health care provider. This is important. Contact a health care provider if:  You keep feeling nauseous or you keep vomiting.  You feel light-headed.  You are still sleepy or having trouble with balance after 24 hours.  You develop a rash.  You have a fever.  You have redness or swelling around the IV site. Get help right away if:  You have trouble breathing.  You have new-onset confusion at home. Summary  For several hours after your procedure, you may feel tired. You may also be forgetful and have poor judgment.  Have a responsible adult stay with you for the time you are told. It is important to have someone help care for you until you are awake and alert.  Rest as told. Do not drive or operate machinery. Do not drink  alcohol or take sleeping pills.  Get help right away if you have trouble breathing, or if you suddenly become confused. This information is not intended to replace advice given to you by your health care provider. Make sure you discuss any questions you have with your health care provider. Document Revised: 01/25/2020 Document Reviewed: 04/13/2019 Elsevier Patient Education  2021 [employer].

## 2020-07-03 ENCOUNTER — Telehealth: Payer: Self-pay | Admitting: Internal Medicine

## 2020-07-03 NOTE — Telephone Encounter (Signed)
Called pt. She wants to cancel procedure for 2/15. She wants to come in for OV to discuss her reflux issues and her medications before she rescheduled. OV scheduled.

## 2020-07-03 NOTE — Telephone Encounter (Signed)
Patient called and wants to reschedule her procedure. Please call [phone]

## 2020-07-05 ENCOUNTER — Encounter (HOSPITAL_COMMUNITY)
Admission: RE | Admit: 2020-07-05 | Discharge: 2020-07-05 | Disposition: A | Discharge status: Home / Self Care | Payer: Medicare HMO | Source: Ambulatory Visit | Attending: Internal Medicine | Admitting: Internal Medicine

## 2020-07-05 ENCOUNTER — Encounter (HOSPITAL_COMMUNITY): Payer: Self-pay

## 2020-07-05 ENCOUNTER — Other Ambulatory Visit (HOSPITAL_COMMUNITY): Payer: Medicare Other

## 2020-07-09 ENCOUNTER — Encounter (HOSPITAL_COMMUNITY): Payer: Self-pay

## 2020-07-09 ENCOUNTER — Ambulatory Visit (HOSPITAL_COMMUNITY): Admit: 2020-07-09 | Payer: Medicare Other

## 2020-07-09 SURGERY — ESOPHAGOGASTRODUODENOSCOPY (EGD) WITH PROPOFOL
Anesthesia: Monitor Anesthesia Care

## 2020-07-24 ENCOUNTER — Other Ambulatory Visit: Payer: Self-pay

## 2020-07-24 ENCOUNTER — Ambulatory Visit (HOSPITAL_COMMUNITY)
Admission: RE | Admit: 2020-07-24 | Discharge: 2020-07-24 | Disposition: A | Discharge status: Home / Self Care | Payer: Medicare Other | Source: Ambulatory Visit | Attending: Physician Assistant | Admitting: Physician Assistant

## 2020-07-24 ENCOUNTER — Other Ambulatory Visit: Payer: Self-pay | Admitting: Physician Assistant

## 2020-07-24 DIAGNOSIS — M7122 Synovial cyst of popliteal space [Baker], left knee: Secondary | ICD-10-CM | POA: Diagnosis not present

## 2020-07-24 DIAGNOSIS — M79662 Pain in left lower leg: Secondary | ICD-10-CM

## 2020-08-01 ENCOUNTER — Encounter: Payer: Self-pay | Admitting: Internal Medicine

## 2020-08-01 ENCOUNTER — Ambulatory Visit: Payer: Medicare HMO | Admitting: Internal Medicine

## 2020-08-09 DIAGNOSIS — M1990 Unspecified osteoarthritis, unspecified site: Secondary | ICD-10-CM | POA: Diagnosis not present

## 2020-09-09 DIAGNOSIS — L0390 Cellulitis, unspecified: Secondary | ICD-10-CM | POA: Diagnosis not present

## 2020-09-10 DIAGNOSIS — S61411A Laceration without foreign body of right hand, initial encounter: Secondary | ICD-10-CM | POA: Diagnosis not present

## 2020-09-10 DIAGNOSIS — W010XXA Fall on same level from slipping, tripping and stumbling without subsequent striking against object, initial encounter: Secondary | ICD-10-CM | POA: Diagnosis not present

## 2020-09-10 DIAGNOSIS — S0101XA Laceration without foreign body of scalp, initial encounter: Secondary | ICD-10-CM | POA: Diagnosis not present

## 2020-09-19 DIAGNOSIS — S61411A Laceration without foreign body of right hand, initial encounter: Secondary | ICD-10-CM | POA: Diagnosis not present

## 2020-10-03 DIAGNOSIS — L0390 Cellulitis, unspecified: Secondary | ICD-10-CM | POA: Diagnosis not present

## 2020-10-03 DIAGNOSIS — T148XXA Other injury of unspecified body region, initial encounter: Secondary | ICD-10-CM | POA: Diagnosis not present

## 2020-10-03 DIAGNOSIS — L247 Irritant contact dermatitis due to plants, except food: Secondary | ICD-10-CM | POA: Diagnosis not present

## 2020-10-03 DIAGNOSIS — Z6822 Body mass index (BMI) 22.0-22.9, adult: Secondary | ICD-10-CM | POA: Diagnosis not present

## 2020-10-28 DIAGNOSIS — L237 Allergic contact dermatitis due to plants, except food: Secondary | ICD-10-CM | POA: Diagnosis not present

## 2020-10-28 DIAGNOSIS — I10 Essential (primary) hypertension: Secondary | ICD-10-CM | POA: Diagnosis not present

## 2020-11-11 DIAGNOSIS — I10 Essential (primary) hypertension: Secondary | ICD-10-CM | POA: Diagnosis not present

## 2020-11-11 DIAGNOSIS — L900 Lichen sclerosus et atrophicus: Secondary | ICD-10-CM | POA: Diagnosis not present

## 2020-11-11 DIAGNOSIS — Z6822 Body mass index (BMI) 22.0-22.9, adult: Secondary | ICD-10-CM | POA: Diagnosis not present

## 2020-11-11 DIAGNOSIS — E785 Hyperlipidemia, unspecified: Secondary | ICD-10-CM | POA: Diagnosis not present

## 2021-01-15 DIAGNOSIS — D529 Folate deficiency anemia, unspecified: Secondary | ICD-10-CM | POA: Diagnosis not present

## 2021-01-15 DIAGNOSIS — D649 Anemia, unspecified: Secondary | ICD-10-CM | POA: Diagnosis not present

## 2021-01-15 DIAGNOSIS — K219 Gastro-esophageal reflux disease without esophagitis: Secondary | ICD-10-CM | POA: Diagnosis not present

## 2021-01-15 DIAGNOSIS — Z6823 Body mass index (BMI) 23.0-23.9, adult: Secondary | ICD-10-CM | POA: Diagnosis not present

## 2021-01-15 DIAGNOSIS — D519 Vitamin B12 deficiency anemia, unspecified: Secondary | ICD-10-CM | POA: Diagnosis not present

## 2021-01-16 DIAGNOSIS — R509 Fever, unspecified: Secondary | ICD-10-CM | POA: Diagnosis not present

## 2021-01-16 DIAGNOSIS — R1110 Vomiting, unspecified: Secondary | ICD-10-CM | POA: Diagnosis not present

## 2021-01-16 DIAGNOSIS — U071 COVID-19: Secondary | ICD-10-CM | POA: Diagnosis not present

## 2021-01-16 DIAGNOSIS — E860 Dehydration: Secondary | ICD-10-CM | POA: Diagnosis not present

## 2021-01-16 DIAGNOSIS — R410 Disorientation, unspecified: Secondary | ICD-10-CM | POA: Diagnosis not present

## 2021-01-28 DIAGNOSIS — Z7982 Long term (current) use of aspirin: Secondary | ICD-10-CM | POA: Diagnosis not present

## 2021-01-28 DIAGNOSIS — I63233 Cerebral infarction due to unspecified occlusion or stenosis of bilateral carotid arteries: Secondary | ICD-10-CM | POA: Diagnosis not present

## 2021-01-28 DIAGNOSIS — I2510 Atherosclerotic heart disease of native coronary artery without angina pectoris: Secondary | ICD-10-CM | POA: Diagnosis not present

## 2021-01-28 DIAGNOSIS — R29810 Facial weakness: Secondary | ICD-10-CM | POA: Diagnosis not present

## 2021-01-28 DIAGNOSIS — G8194 Hemiplegia, unspecified affecting left nondominant side: Secondary | ICD-10-CM | POA: Diagnosis not present

## 2021-01-28 DIAGNOSIS — Z743 Need for continuous supervision: Secondary | ICD-10-CM | POA: Diagnosis not present

## 2021-01-28 DIAGNOSIS — S12101A Unspecified nondisplaced fracture of second cervical vertebra, initial encounter for closed fracture: Secondary | ICD-10-CM | POA: Diagnosis not present

## 2021-01-28 DIAGNOSIS — I081 Rheumatic disorders of both mitral and tricuspid valves: Secondary | ICD-10-CM | POA: Diagnosis not present

## 2021-01-28 DIAGNOSIS — R410 Disorientation, unspecified: Secondary | ICD-10-CM | POA: Diagnosis not present

## 2021-01-28 DIAGNOSIS — Z888 Allergy status to other drugs, medicaments and biological substances status: Secondary | ICD-10-CM | POA: Diagnosis not present

## 2021-01-28 DIAGNOSIS — I700 Atherosclerosis of aorta: Secondary | ICD-10-CM | POA: Diagnosis not present

## 2021-01-28 DIAGNOSIS — U071 COVID-19: Secondary | ICD-10-CM | POA: Diagnosis not present

## 2021-01-28 DIAGNOSIS — M47812 Spondylosis without myelopathy or radiculopathy, cervical region: Secondary | ICD-10-CM | POA: Diagnosis not present

## 2021-01-28 DIAGNOSIS — S299XXA Unspecified injury of thorax, initial encounter: Secondary | ICD-10-CM | POA: Diagnosis not present

## 2021-01-28 DIAGNOSIS — Z515 Encounter for palliative care: Secondary | ICD-10-CM | POA: Diagnosis not present

## 2021-01-28 DIAGNOSIS — I4891 Unspecified atrial fibrillation: Secondary | ICD-10-CM | POA: Diagnosis not present

## 2021-01-28 DIAGNOSIS — M25472 Effusion, left ankle: Secondary | ICD-10-CM | POA: Diagnosis not present

## 2021-01-28 DIAGNOSIS — M7989 Other specified soft tissue disorders: Secondary | ICD-10-CM | POA: Diagnosis not present

## 2021-01-28 DIAGNOSIS — N3289 Other specified disorders of bladder: Secondary | ICD-10-CM | POA: Diagnosis not present

## 2021-01-28 DIAGNOSIS — D6869 Other thrombophilia: Secondary | ICD-10-CM | POA: Diagnosis not present

## 2021-01-28 DIAGNOSIS — R600 Localized edema: Secondary | ICD-10-CM | POA: Diagnosis not present

## 2021-01-28 DIAGNOSIS — R404 Transient alteration of awareness: Secondary | ICD-10-CM | POA: Diagnosis not present

## 2021-01-28 DIAGNOSIS — M25462 Effusion, left knee: Secondary | ICD-10-CM | POA: Diagnosis not present

## 2021-01-28 DIAGNOSIS — D72829 Elevated white blood cell count, unspecified: Secondary | ICD-10-CM | POA: Diagnosis not present

## 2021-01-28 DIAGNOSIS — M160 Bilateral primary osteoarthritis of hip: Secondary | ICD-10-CM | POA: Diagnosis not present

## 2021-01-28 DIAGNOSIS — S82892A Other fracture of left lower leg, initial encounter for closed fracture: Secondary | ICD-10-CM | POA: Diagnosis not present

## 2021-01-28 DIAGNOSIS — J8410 Pulmonary fibrosis, unspecified: Secondary | ICD-10-CM | POA: Diagnosis not present

## 2021-01-28 DIAGNOSIS — R29725 NIHSS score 25: Secondary | ICD-10-CM | POA: Diagnosis not present

## 2021-01-28 DIAGNOSIS — E876 Hypokalemia: Secondary | ICD-10-CM | POA: Diagnosis not present

## 2021-01-28 DIAGNOSIS — M4316 Spondylolisthesis, lumbar region: Secondary | ICD-10-CM | POA: Diagnosis not present

## 2021-01-28 DIAGNOSIS — M6282 Rhabdomyolysis: Secondary | ICD-10-CM | POA: Diagnosis not present

## 2021-01-28 DIAGNOSIS — R6889 Other general symptoms and signs: Secondary | ICD-10-CM | POA: Diagnosis not present

## 2021-01-28 DIAGNOSIS — M4312 Spondylolisthesis, cervical region: Secondary | ICD-10-CM | POA: Diagnosis not present

## 2021-01-28 DIAGNOSIS — R29818 Other symptoms and signs involving the nervous system: Secondary | ICD-10-CM | POA: Diagnosis not present

## 2021-01-28 DIAGNOSIS — M1712 Unilateral primary osteoarthritis, left knee: Secondary | ICD-10-CM | POA: Diagnosis not present

## 2021-01-28 DIAGNOSIS — Z66 Do not resuscitate: Secondary | ICD-10-CM | POA: Diagnosis not present

## 2021-01-28 DIAGNOSIS — I639 Cerebral infarction, unspecified: Secondary | ICD-10-CM | POA: Diagnosis not present

## 2021-01-28 DIAGNOSIS — I63511 Cerebral infarction due to unspecified occlusion or stenosis of right middle cerebral artery: Secondary | ICD-10-CM | POA: Diagnosis not present

## 2021-01-28 DIAGNOSIS — J984 Other disorders of lung: Secondary | ICD-10-CM | POA: Diagnosis not present

## 2021-01-28 DIAGNOSIS — S12110A Anterior displaced Type II dens fracture, initial encounter for closed fracture: Secondary | ICD-10-CM | POA: Diagnosis not present

## 2021-01-28 DIAGNOSIS — X58XXXA Exposure to other specified factors, initial encounter: Secondary | ICD-10-CM | POA: Diagnosis not present

## 2021-01-28 DIAGNOSIS — R000 Tachycardia, unspecified: Secondary | ICD-10-CM | POA: Diagnosis not present

## 2021-01-28 DIAGNOSIS — I480 Paroxysmal atrial fibrillation: Secondary | ICD-10-CM | POA: Diagnosis not present

## 2021-01-28 DIAGNOSIS — Z8673 Personal history of transient ischemic attack (TIA), and cerebral infarction without residual deficits: Secondary | ICD-10-CM | POA: Diagnosis not present

## 2021-01-28 DIAGNOSIS — S3991XA Unspecified injury of abdomen, initial encounter: Secondary | ICD-10-CM | POA: Diagnosis not present
